# Patient Record
Sex: Female | Born: 1945 | Race: Black or African American | Hispanic: No | Marital: Married | Smoking: Never smoker
Health system: Southern US, Community
[De-identification: ages and names within clinical notes are randomized; demographics above are authoritative.]

## PROBLEM LIST (undated history)

## (undated) DIAGNOSIS — K5790 Diverticulosis of intestine, part unspecified, without perforation or abscess without bleeding: Secondary | ICD-10-CM

## (undated) DIAGNOSIS — N189 Chronic kidney disease, unspecified: Secondary | ICD-10-CM

## (undated) DIAGNOSIS — I10 Essential (primary) hypertension: Secondary | ICD-10-CM

## (undated) DIAGNOSIS — N183 Chronic kidney disease, stage 3 unspecified: Secondary | ICD-10-CM

## (undated) DIAGNOSIS — K589 Irritable bowel syndrome without diarrhea: Secondary | ICD-10-CM

## (undated) DIAGNOSIS — K635 Polyp of colon: Secondary | ICD-10-CM

## (undated) DIAGNOSIS — R059 Cough, unspecified: Secondary | ICD-10-CM

## (undated) DIAGNOSIS — Z923 Personal history of irradiation: Secondary | ICD-10-CM

## (undated) DIAGNOSIS — E7800 Pure hypercholesterolemia, unspecified: Secondary | ICD-10-CM

## (undated) DIAGNOSIS — E1122 Type 2 diabetes mellitus with diabetic chronic kidney disease: Secondary | ICD-10-CM

## (undated) DIAGNOSIS — E119 Type 2 diabetes mellitus without complications: Secondary | ICD-10-CM

## (undated) DIAGNOSIS — M1990 Unspecified osteoarthritis, unspecified site: Secondary | ICD-10-CM

## (undated) DIAGNOSIS — R05 Cough: Secondary | ICD-10-CM

## (undated) DIAGNOSIS — N1832 Chronic kidney disease, stage 3b: Secondary | ICD-10-CM

## (undated) HISTORY — PX: ABDOMINAL HYSTERECTOMY: SHX81

## (undated) HISTORY — PX: TONSILLECTOMY: SUR1361

## (undated) HISTORY — PX: COLONOSCOPY: SHX174

## (undated) HISTORY — PX: TUBAL LIGATION: SHX77

## (undated) HISTORY — PX: VAGINAL HYSTERECTOMY: SHX2639

## (undated) SURGERY — Surgical Case
Anesthesia: *Unknown

---

## 2004-10-06 ENCOUNTER — Ambulatory Visit: Payer: Self-pay | Admitting: Family Medicine

## 2004-11-07 ENCOUNTER — Ambulatory Visit: Payer: Self-pay | Admitting: Gastroenterology

## 2005-05-12 ENCOUNTER — Emergency Department: Payer: Self-pay | Admitting: Emergency Medicine

## 2005-10-12 ENCOUNTER — Ambulatory Visit: Payer: Self-pay | Admitting: Unknown Physician Specialty

## 2006-10-14 ENCOUNTER — Ambulatory Visit: Payer: Self-pay | Admitting: Unknown Physician Specialty

## 2007-10-17 ENCOUNTER — Ambulatory Visit: Payer: Self-pay | Admitting: Unknown Physician Specialty

## 2007-10-20 ENCOUNTER — Ambulatory Visit: Payer: Self-pay | Admitting: Unknown Physician Specialty

## 2008-04-18 ENCOUNTER — Ambulatory Visit: Payer: Self-pay | Admitting: Unknown Physician Specialty

## 2008-07-17 ENCOUNTER — Ambulatory Visit: Payer: Self-pay | Admitting: Unknown Physician Specialty

## 2008-07-18 ENCOUNTER — Ambulatory Visit: Payer: Self-pay | Admitting: Unknown Physician Specialty

## 2008-12-12 ENCOUNTER — Ambulatory Visit: Payer: Self-pay | Admitting: Unknown Physician Specialty

## 2009-11-11 ENCOUNTER — Ambulatory Visit: Payer: Self-pay | Admitting: Unknown Physician Specialty

## 2009-11-20 ENCOUNTER — Ambulatory Visit: Payer: Self-pay | Admitting: Unknown Physician Specialty

## 2009-12-21 ENCOUNTER — Ambulatory Visit: Payer: Self-pay | Admitting: Unknown Physician Specialty

## 2009-12-21 HISTORY — PX: BREAST EXCISIONAL BIOPSY: SUR124

## 2010-01-21 ENCOUNTER — Ambulatory Visit: Payer: Self-pay | Admitting: Unknown Physician Specialty

## 2010-02-12 ENCOUNTER — Ambulatory Visit: Payer: Self-pay | Admitting: Unknown Physician Specialty

## 2010-02-18 ENCOUNTER — Ambulatory Visit: Payer: Self-pay | Admitting: Unknown Physician Specialty

## 2010-03-20 ENCOUNTER — Ambulatory Visit: Payer: Self-pay | Admitting: Surgery

## 2010-03-27 ENCOUNTER — Ambulatory Visit: Payer: Self-pay | Admitting: Surgery

## 2011-03-05 ENCOUNTER — Ambulatory Visit: Payer: Self-pay | Admitting: Unknown Physician Specialty

## 2012-04-12 ENCOUNTER — Ambulatory Visit: Payer: Self-pay | Admitting: Unknown Physician Specialty

## 2013-06-22 ENCOUNTER — Ambulatory Visit: Payer: Self-pay | Admitting: Physician Assistant

## 2013-09-22 ENCOUNTER — Ambulatory Visit: Payer: Self-pay | Admitting: Unknown Physician Specialty

## 2014-08-24 ENCOUNTER — Ambulatory Visit: Payer: Self-pay | Admitting: Internal Medicine

## 2015-07-23 ENCOUNTER — Other Ambulatory Visit: Payer: Self-pay | Admitting: Internal Medicine

## 2015-07-23 DIAGNOSIS — Z1231 Encounter for screening mammogram for malignant neoplasm of breast: Secondary | ICD-10-CM

## 2015-08-27 ENCOUNTER — Ambulatory Visit
Admission: RE | Admit: 2015-08-27 | Discharge: 2015-08-27 | Disposition: A | Discharge status: Home / Self Care | Payer: Medicare Other | Source: Ambulatory Visit | Attending: Internal Medicine | Admitting: Internal Medicine

## 2015-08-27 ENCOUNTER — Other Ambulatory Visit: Payer: Self-pay | Admitting: Internal Medicine

## 2015-08-27 DIAGNOSIS — Z1231 Encounter for screening mammogram for malignant neoplasm of breast: Secondary | ICD-10-CM

## 2016-07-27 ENCOUNTER — Other Ambulatory Visit: Payer: Self-pay | Admitting: Internal Medicine

## 2016-07-27 DIAGNOSIS — Z1231 Encounter for screening mammogram for malignant neoplasm of breast: Secondary | ICD-10-CM

## 2016-09-28 ENCOUNTER — Ambulatory Visit
Admission: RE | Admit: 2016-09-28 | Discharge: 2016-09-28 | Disposition: A | Discharge status: Home / Self Care | Payer: Medicare Other | Source: Ambulatory Visit | Attending: Internal Medicine | Admitting: Internal Medicine

## 2016-09-28 ENCOUNTER — Other Ambulatory Visit: Payer: Self-pay | Admitting: Internal Medicine

## 2016-09-28 DIAGNOSIS — Z1231 Encounter for screening mammogram for malignant neoplasm of breast: Secondary | ICD-10-CM | POA: Diagnosis not present

## 2016-10-29 ENCOUNTER — Other Ambulatory Visit: Payer: Self-pay | Admitting: Internal Medicine

## 2016-10-29 DIAGNOSIS — N183 Chronic kidney disease, stage 3 unspecified: Secondary | ICD-10-CM

## 2016-11-05 ENCOUNTER — Ambulatory Visit
Admission: RE | Admit: 2016-11-05 | Discharge: 2016-11-05 | Disposition: A | Discharge status: Home / Self Care | Payer: Medicare Other | Source: Ambulatory Visit | Attending: Internal Medicine | Admitting: Internal Medicine

## 2016-11-05 DIAGNOSIS — N183 Chronic kidney disease, stage 3 unspecified: Secondary | ICD-10-CM

## 2017-08-24 ENCOUNTER — Other Ambulatory Visit: Payer: Self-pay | Admitting: Internal Medicine

## 2017-08-24 DIAGNOSIS — Z1231 Encounter for screening mammogram for malignant neoplasm of breast: Secondary | ICD-10-CM

## 2017-10-04 ENCOUNTER — Ambulatory Visit
Admission: RE | Admit: 2017-10-04 | Discharge: 2017-10-04 | Disposition: A | Discharge status: Home / Self Care | Payer: Medicare Other | Source: Ambulatory Visit | Attending: Internal Medicine | Admitting: Internal Medicine

## 2017-10-04 DIAGNOSIS — Z1231 Encounter for screening mammogram for malignant neoplasm of breast: Secondary | ICD-10-CM | POA: Diagnosis not present

## 2017-11-09 ENCOUNTER — Encounter: Payer: Self-pay | Admitting: *Deleted

## 2017-11-18 ENCOUNTER — Ambulatory Visit: Payer: Medicare Other | Admitting: Anesthesiology

## 2017-11-18 ENCOUNTER — Encounter
Admission: RE | Disposition: A | Discharge status: Home / Self Care | Payer: Self-pay | Source: Ambulatory Visit | Attending: Ophthalmology

## 2017-11-18 ENCOUNTER — Encounter: Payer: Self-pay | Admitting: *Deleted

## 2017-11-18 ENCOUNTER — Other Ambulatory Visit: Payer: Self-pay

## 2017-11-18 ENCOUNTER — Ambulatory Visit
Admission: RE | Admit: 2017-11-18 | Discharge: 2017-11-18 | Disposition: A | Discharge status: Home / Self Care | Payer: Medicare Other | Source: Ambulatory Visit | Attending: Ophthalmology | Admitting: Ophthalmology

## 2017-11-18 DIAGNOSIS — E7800 Pure hypercholesterolemia, unspecified: Secondary | ICD-10-CM | POA: Insufficient documentation

## 2017-11-18 DIAGNOSIS — I10 Essential (primary) hypertension: Secondary | ICD-10-CM | POA: Diagnosis not present

## 2017-11-18 DIAGNOSIS — Z7984 Long term (current) use of oral hypoglycemic drugs: Secondary | ICD-10-CM | POA: Insufficient documentation

## 2017-11-18 DIAGNOSIS — Z90710 Acquired absence of both cervix and uterus: Secondary | ICD-10-CM | POA: Diagnosis not present

## 2017-11-18 DIAGNOSIS — M1990 Unspecified osteoarthritis, unspecified site: Secondary | ICD-10-CM | POA: Diagnosis not present

## 2017-11-18 DIAGNOSIS — Z888 Allergy status to other drugs, medicaments and biological substances status: Secondary | ICD-10-CM | POA: Diagnosis not present

## 2017-11-18 DIAGNOSIS — H2512 Age-related nuclear cataract, left eye: Secondary | ICD-10-CM | POA: Diagnosis present

## 2017-11-18 DIAGNOSIS — E119 Type 2 diabetes mellitus without complications: Secondary | ICD-10-CM | POA: Insufficient documentation

## 2017-11-18 DIAGNOSIS — Z79899 Other long term (current) drug therapy: Secondary | ICD-10-CM | POA: Diagnosis not present

## 2017-11-18 DIAGNOSIS — K589 Irritable bowel syndrome without diarrhea: Secondary | ICD-10-CM | POA: Insufficient documentation

## 2017-11-18 HISTORY — DX: Cough, unspecified: R05.9

## 2017-11-18 HISTORY — DX: Chronic kidney disease, unspecified: N18.9

## 2017-11-18 HISTORY — DX: Unspecified osteoarthritis, unspecified site: M19.90

## 2017-11-18 HISTORY — DX: Cough: R05

## 2017-11-18 HISTORY — DX: Type 2 diabetes mellitus without complications: E11.9

## 2017-11-18 HISTORY — DX: Essential (primary) hypertension: I10

## 2017-11-18 HISTORY — DX: Irritable bowel syndrome, unspecified: K58.9

## 2017-11-18 HISTORY — PX: CATARACT EXTRACTION W/PHACO: SHX586

## 2017-11-18 LAB — GLUCOSE, CAPILLARY: GLUCOSE-CAPILLARY: 162 mg/dL — AB (ref 65–99)

## 2017-11-18 SURGERY — PHACOEMULSIFICATION, CATARACT, WITH IOL INSERTION
Anesthesia: Monitor Anesthesia Care | Site: Eye | Laterality: Left | Wound class: Clean

## 2017-11-18 MED ORDER — FENTANYL CITRATE (PF) 100 MCG/2ML IJ SOLN
INTRAMUSCULAR | Status: AC
  Filled 2017-11-18: qty 2

## 2017-11-18 MED ORDER — ARMC OPHTHALMIC DILATING DROPS
OPHTHALMIC | Status: AC
  Filled 2017-11-18: qty 0.4

## 2017-11-18 MED ORDER — SODIUM HYALURONATE 10 MG/ML IO SOLN
INTRAOCULAR | Status: DC | PRN
  Administered 2017-11-18: 0.55 mL via INTRAOCULAR

## 2017-11-18 MED ORDER — ARMC OPHTHALMIC DILATING DROPS
1.0000 "application " | OPHTHALMIC | Status: AC
  Administered 2017-11-18 (×2): 1 via OPHTHALMIC

## 2017-11-18 MED ORDER — POVIDONE-IODINE 5 % OP SOLN
OPHTHALMIC | Status: DC | PRN
  Administered 2017-11-18: 1 via OPHTHALMIC

## 2017-11-18 MED ORDER — MOXIFLOXACIN HCL 0.5 % OP SOLN
OPHTHALMIC | Status: AC
  Filled 2017-11-18: qty 3

## 2017-11-18 MED ORDER — LIDOCAINE HCL (PF) 4 % IJ SOLN
INTRAMUSCULAR | Status: AC
  Filled 2017-11-18: qty 5

## 2017-11-18 MED ORDER — FENTANYL CITRATE (PF) 100 MCG/2ML IJ SOLN
INTRAMUSCULAR | Status: DC | PRN
  Administered 2017-11-18: 50 ug via INTRAVENOUS

## 2017-11-18 MED ORDER — MIDAZOLAM HCL 2 MG/2ML IJ SOLN
INTRAMUSCULAR | Status: DC | PRN
  Administered 2017-11-18: 1 mg via INTRAVENOUS

## 2017-11-18 MED ORDER — MOXIFLOXACIN HCL 0.5 % OP SOLN: 1.0000 [drp] | OPHTHALMIC | Status: DC | PRN

## 2017-11-18 MED ORDER — SODIUM CHLORIDE 0.9 % IV SOLN
INTRAVENOUS | Status: DC
  Administered 2017-11-18 (×2): via INTRAVENOUS

## 2017-11-18 MED ORDER — BSS IO SOLN
INTRAOCULAR | Status: DC | PRN
  Administered 2017-11-18: 1 via INTRAOCULAR

## 2017-11-18 MED ORDER — MOXIFLOXACIN HCL 0.5 % OP SOLN
OPHTHALMIC | Status: DC | PRN
  Administered 2017-11-18: 0.2 mL via OPHTHALMIC

## 2017-11-18 MED ORDER — POVIDONE-IODINE 5 % OP SOLN
OPHTHALMIC | Status: AC
  Filled 2017-11-18: qty 30

## 2017-11-18 MED ORDER — MIDAZOLAM HCL 2 MG/2ML IJ SOLN
INTRAMUSCULAR | Status: AC
  Filled 2017-11-18: qty 2

## 2017-11-18 MED ORDER — SODIUM HYALURONATE 23 MG/ML IO SOLN
INTRAOCULAR | Status: AC
  Filled 2017-11-18: qty 0.6

## 2017-11-18 MED ORDER — LIDOCAINE HCL (PF) 4 % IJ SOLN
INTRAMUSCULAR | Status: DC | PRN
  Administered 2017-11-18: 4 mL via OPHTHALMIC

## 2017-11-18 MED ORDER — SODIUM HYALURONATE 23 MG/ML IO SOLN
INTRAOCULAR | Status: DC | PRN
  Administered 2017-11-18: 0.6 mL via INTRAOCULAR

## 2017-11-18 MED ORDER — EPINEPHRINE PF 1 MG/ML IJ SOLN
INTRAMUSCULAR | Status: AC
  Filled 2017-11-18: qty 1

## 2017-11-18 SURGICAL SUPPLY — 16 items
DISSECTOR HYDRO NUCLEUS 50X22 (MISCELLANEOUS) ×3 IMPLANT
GLOVE BIO SURGEON STRL SZ8 (GLOVE) ×3 IMPLANT
GLOVE BIOGEL M 6.5 STRL (GLOVE) ×3 IMPLANT
GLOVE SURG LX 7.5 STRW (GLOVE) ×2
GLOVE SURG LX STRL 7.5 STRW (GLOVE) ×1 IMPLANT
GOWN STRL REUS W/ TWL LRG LVL3 (GOWN DISPOSABLE) ×2 IMPLANT
GOWN STRL REUS W/TWL LRG LVL3 (GOWN DISPOSABLE) ×4
LABEL CATARACT MEDS ST (LABEL) ×3 IMPLANT
LENS IOL TECNIS ITEC 18.0 (Intraocular Lens) ×3 IMPLANT
PACK CATARACT (MISCELLANEOUS) ×3 IMPLANT
PACK CATARACT KING (MISCELLANEOUS) ×3 IMPLANT
PACK EYE AFTER SURG (MISCELLANEOUS) ×3 IMPLANT
SOL BSS BAG (MISCELLANEOUS) ×3
SOLUTION BSS BAG (MISCELLANEOUS) ×1 IMPLANT
WATER STERILE IRR 250ML POUR (IV SOLUTION) ×3 IMPLANT
WIPE NON LINTING 3.25X3.25 (MISCELLANEOUS) ×3 IMPLANT

## 2017-11-18 NOTE — H&P (Signed)
The History and Physical notes are on paper, have been signed, and are to be scanned.   I have examined the patient and there are no changes to the H&P.   Emma Torres 11/18/2017 9:14 AM

## 2017-11-18 NOTE — Transfer of Care (Signed)
Immediate Anesthesia Transfer of Care Note  Patient: Emma Torres  Procedure(s) Performed: CATARACT EXTRACTION PHACO AND INTRAOCULAR LENS PLACEMENT ([facility]) (Left Eye)  Patient Location: PACU  Anesthesia Type:MAC  Level of Consciousness: awake, alert  and oriented  Airway & Oxygen Therapy: Patient Spontanous Breathing  Post-op Assessment: Report given to RN and Post -op Vital signs reviewed and stable  Post vital signs: Reviewed and stable  Last Vitals:  Vitals:   11/18/17 0952 11/18/17 0953  BP: 140/67 140/67  Pulse: 61 61  Resp: 16 12  Temp: 36.9 C 36.9 C  SpO2: 99% 99%    Last Pain:  Vitals:   11/18/17 0953  TempSrc: Oral         Complications: No apparent anesthesia complications

## 2017-11-18 NOTE — Anesthesia Post-op Follow-up Note (Signed)
Anesthesia QCDR form completed.        

## 2017-11-18 NOTE — Anesthesia Postprocedure Evaluation (Signed)
Anesthesia Post Note  Patient: Emma Torres  Procedure(s) Performed: CATARACT EXTRACTION PHACO AND INTRAOCULAR LENS PLACEMENT ([place]) (Left Eye)  Patient location during evaluation: PACU Anesthesia Type: MAC Level of consciousness: awake, awake and alert and oriented Pain management: pain level controlled Vital Signs Assessment: post-procedure vital signs reviewed and stable Respiratory status: spontaneous breathing Cardiovascular status: blood pressure returned to baseline Postop Assessment: no headache, no backache and no apparent nausea or vomiting Anesthetic complications: no     Last Vitals:  Vitals:   11/18/17 0952 11/18/17 0953  BP: 140/67 140/67  Pulse: 61 61  Resp: 16 12  Temp: 36.9 C 36.9 C  SpO2: 99% 99%    Last Pain:  Vitals:   11/18/17 0953  TempSrc: Oral                 Hedda Slade

## 2017-11-18 NOTE — Anesthesia Preprocedure Evaluation (Signed)
Anesthesia Evaluation  Patient identified by MRN, date of birth, ID band Patient awake    Reviewed: Allergy & Precautions, H&P , NPO status , Patient's Chart, lab work & pertinent test results, reviewed documented beta blocker date and time   Airway Mallampati: II  TM Distance: >3 FB Neck ROM: full    Dental no notable dental hx. (+) Teeth Intact   Pulmonary neg pulmonary ROS,    Pulmonary exam normal breath sounds clear to auscultation       Cardiovascular Exercise Tolerance: Good hypertension, negative cardio ROS   Rhythm:regular Rate:Normal     Neuro/Psych negative neurological ROS  negative psych ROS   GI/Hepatic negative GI ROS, Neg liver ROS,   Endo/Other  negative endocrine ROSdiabetes  Renal/GU Renal disease     Musculoskeletal   Abdominal   Peds  Hematology negative hematology ROS (+)   Anesthesia Other Findings   Reproductive/Obstetrics negative OB ROS                             Anesthesia Physical Anesthesia Plan  ASA: III  Anesthesia Plan: MAC   Post-op Pain Management:    Induction:   PONV Risk Score and Plan: 2  Airway Management Planned:   Additional Equipment:   Intra-op Plan:   Post-operative Plan:   Informed Consent: I have reviewed the patients History and Physical, chart, labs and discussed the procedure including the risks, benefits and alternatives for the proposed anesthesia with the patient or authorized representative who has indicated his/her understanding and acceptance.     Plan Discussed with: CRNA  Anesthesia Plan Comments:         Anesthesia Quick Evaluation

## 2017-11-18 NOTE — Op Note (Signed)
OPERATIVE NOTE  Emma Torres [id number] 11/18/2017   PREOPERATIVE DIAGNOSIS:  Nuclear sclerotic cataract left eye.  H25.12   POSTOPERATIVE DIAGNOSIS:    Nuclear sclerotic cataract left eye.     PROCEDURE:  Phacoemusification with posterior chamber intraocular lens placement of the left eye   LENS:   Implant Name Type Inv. Item Serial No. Manufacturer Lot No. LRB No. Used  LENS IOL DIOP 18.0 - [id number] Intraocular Lens LENS IOL DIOP 18.0 [id number]  Left 1       PCB00 +18.0   ULTRASOUND TIME: 0 minutes 28.6 seconds.  CDE 3.10   SURGEON:  Benay Pillow, MD, MPH   ANESTHESIA:  Topical with tetracaine drops augmented with 1% preservative-free intracameral lidocaine.  ESTIMATED BLOOD LOSS: <1 mL   COMPLICATIONS:  None.   DESCRIPTION OF PROCEDURE:  The patient was identified in the holding room and transported to the operating room and placed in the supine position under the operating microscope.  The left eye was identified as the operative eye and it was prepped and draped in the usual sterile ophthalmic fashion.   A 1.0 millimeter clear-corneal paracentesis was made at the 5:00 position. 0.5 ml of preservative-free 1% lidocaine with epinephrine was injected into the anterior chamber.  The anterior chamber was filled with Healon 5 viscoelastic.  A 2.4 millimeter keratome was used to make a near-clear corneal incision at the 2:00 position.  A curvilinear capsulorrhexis was made with a cystotome and capsulorrhexis forceps.  Balanced salt solution was used to hydrodissect and hydrodelineate the nucleus.   Phacoemulsification was then used in stop and chop fashion to remove the lens nucleus and epinucleus.  The remaining cortex was then removed using the irrigation and aspiration handpiece. Healon was then placed into the capsular bag to distend it for lens placement.  A lens was then injected into the capsular bag.  The remaining viscoelastic was aspirated.   Wounds were  hydrated with balanced salt solution.  The anterior chamber was inflated to a physiologic pressure with balanced salt solution.  Intracameral vigamox 0.1 mL undiltued was injected into the eye and a drop placed onto the ocular surface.  No wound leaks were noted.  The patient was taken to the recovery room in stable condition without complications of anesthesia or surgery  Benay Pillow 11/18/2017, 9:50 AM

## 2017-11-18 NOTE — Discharge Instructions (Signed)
Eye Surgery Discharge Instructions  Expect mild scratchy sensation or mild soreness. DO NOT RUB YOUR EYE!  The day of surgery:  Minimal physical activity, but bed rest is not required  No reading, computer work, or close hand work  No bending, lifting, or straining.  May watch TV  For 24 hours:  No driving, legal decisions, or alcoholic beverages  Safety precautions  Eat anything you prefer: It is better to start with liquids, then soup then solid foods.  _____ Eye patch should be worn until postoperative exam tomorrow.  ____ Solar shield eyeglasses should be worn for comfort in the sunlight/patch while sleeping  Resume all regular medications including aspirin or Coumadin if these were discontinued prior to surgery. You may shower, bathe, shave, or wash your hair. Tylenol may be taken for mild discomfort.  Call your doctor if you experience significant pain, nausea, or vomiting, fever > 101 or other signs of infection. [phone] or [phone] Specific instructions:  Follow-up Information    Eulogio Bear, MD Follow up in 1 day(s).   Specialty:  Ophthalmology Why:  Friday, November 19, 2017 at [facility] information: [address] [place] [address] [phone]

## 2017-11-18 NOTE — Anesthesia Procedure Notes (Signed)
Procedure Name: MAC Date/Time: 11/18/2017 9:25 AM Performed by: Allean Found, CRNA Pre-anesthesia Checklist: Patient identified, Emergency Drugs available, Suction available, Patient being monitored and Timeout performed Patient Re-evaluated:Patient Re-evaluated prior to induction Oxygen Delivery Method: Nasal cannula Preoxygenation: Pre-oxygenation with 100% oxygen Placement Confirmation: positive ETCO2

## 2018-01-05 ENCOUNTER — Encounter: Payer: Self-pay | Admitting: *Deleted

## 2018-01-13 ENCOUNTER — Ambulatory Visit: Payer: Medicare Other | Admitting: Certified Registered"

## 2018-01-13 ENCOUNTER — Other Ambulatory Visit: Payer: Self-pay

## 2018-01-13 ENCOUNTER — Ambulatory Visit
Admission: RE | Admit: 2018-01-13 | Discharge: 2018-01-13 | Disposition: A | Discharge status: Home / Self Care | Payer: Medicare Other | Source: Ambulatory Visit | Attending: Ophthalmology | Admitting: Ophthalmology

## 2018-01-13 ENCOUNTER — Encounter
Admission: RE | Disposition: A | Discharge status: Home / Self Care | Payer: Self-pay | Source: Ambulatory Visit | Attending: Ophthalmology

## 2018-01-13 ENCOUNTER — Encounter: Payer: Self-pay | Admitting: *Deleted

## 2018-01-13 DIAGNOSIS — Z7982 Long term (current) use of aspirin: Secondary | ICD-10-CM | POA: Diagnosis not present

## 2018-01-13 DIAGNOSIS — I10 Essential (primary) hypertension: Secondary | ICD-10-CM | POA: Diagnosis not present

## 2018-01-13 DIAGNOSIS — E119 Type 2 diabetes mellitus without complications: Secondary | ICD-10-CM | POA: Diagnosis not present

## 2018-01-13 DIAGNOSIS — Z79899 Other long term (current) drug therapy: Secondary | ICD-10-CM | POA: Insufficient documentation

## 2018-01-13 DIAGNOSIS — E7800 Pure hypercholesterolemia, unspecified: Secondary | ICD-10-CM | POA: Diagnosis not present

## 2018-01-13 DIAGNOSIS — Z7984 Long term (current) use of oral hypoglycemic drugs: Secondary | ICD-10-CM | POA: Insufficient documentation

## 2018-01-13 DIAGNOSIS — K589 Irritable bowel syndrome without diarrhea: Secondary | ICD-10-CM | POA: Diagnosis not present

## 2018-01-13 DIAGNOSIS — H2511 Age-related nuclear cataract, right eye: Secondary | ICD-10-CM | POA: Insufficient documentation

## 2018-01-13 DIAGNOSIS — M1990 Unspecified osteoarthritis, unspecified site: Secondary | ICD-10-CM | POA: Insufficient documentation

## 2018-01-13 HISTORY — PX: CATARACT EXTRACTION W/PHACO: SHX586

## 2018-01-13 LAB — GLUCOSE, CAPILLARY: Glucose-Capillary: 115 mg/dL — ABNORMAL HIGH (ref 65–99)

## 2018-01-13 SURGERY — PHACOEMULSIFICATION, CATARACT, WITH IOL INSERTION
Anesthesia: Monitor Anesthesia Care | Laterality: Right

## 2018-01-13 MED ORDER — MIDAZOLAM HCL 2 MG/2ML IJ SOLN
INTRAMUSCULAR | Status: DC | PRN
  Administered 2018-01-13 (×2): 1 mg via INTRAVENOUS

## 2018-01-13 MED ORDER — FENTANYL CITRATE (PF) 100 MCG/2ML IJ SOLN
25.0000 ug | INTRAMUSCULAR | Status: DC | PRN
  Administered 2018-01-13: 25 ug via INTRAVENOUS

## 2018-01-13 MED ORDER — SODIUM HYALURONATE 23 MG/ML IO SOLN
INTRAOCULAR | Status: DC | PRN
  Administered 2018-01-13: 0.6 mL via INTRAOCULAR

## 2018-01-13 MED ORDER — MOXIFLOXACIN HCL 0.5 % OP SOLN
OPHTHALMIC | Status: DC | PRN
Start: 2018-01-13 — End: 2018-01-13
  Administered 2018-01-13: .5 mL

## 2018-01-13 MED ORDER — ARMC OPHTHALMIC DILATING DROPS
1.0000 "application " | OPHTHALMIC | Status: AC
  Administered 2018-01-13 (×3): 1 via OPHTHALMIC

## 2018-01-13 MED ORDER — FENTANYL CITRATE (PF) 100 MCG/2ML IJ SOLN: 25.0000 ug | INTRAMUSCULAR | Status: DC | PRN

## 2018-01-13 MED ORDER — SODIUM HYALURONATE 23 MG/ML IO SOLN
INTRAOCULAR | Status: AC
  Filled 2018-01-13: qty 0.6

## 2018-01-13 MED ORDER — LIDOCAINE HCL (PF) 4 % IJ SOLN
INTRAMUSCULAR | Status: AC
  Filled 2018-01-13: qty 5

## 2018-01-13 MED ORDER — ONDANSETRON HCL 4 MG/2ML IJ SOLN
4.0000 mg | Freq: Once | INTRAMUSCULAR | Status: AC | PRN
  Administered 2018-01-13: 4 mg via INTRAVENOUS

## 2018-01-13 MED ORDER — MIDAZOLAM HCL 2 MG/2ML IJ SOLN
INTRAMUSCULAR | Status: AC
  Filled 2018-01-13: qty 2

## 2018-01-13 MED ORDER — ONDANSETRON HCL 4 MG/2ML IJ SOLN: 4.0000 mg | Freq: Once | INTRAMUSCULAR | Status: DC | PRN

## 2018-01-13 MED ORDER — MOXIFLOXACIN HCL 0.5 % OP SOLN: 1.0000 [drp] | OPHTHALMIC | Status: DC | PRN

## 2018-01-13 MED ORDER — MOXIFLOXACIN HCL 0.5 % OP SOLN
OPHTHALMIC | Status: AC
  Filled 2018-01-13: qty 3

## 2018-01-13 MED ORDER — SODIUM HYALURONATE 10 MG/ML IO SOLN
INTRAOCULAR | Status: DC | PRN
  Administered 2018-01-13: 0.55 mL via INTRAOCULAR

## 2018-01-13 MED ORDER — BSS PLUS IO SOLN
INTRAOCULAR | Status: DC | PRN
  Administered 2018-01-13: 1 via INTRAOCULAR

## 2018-01-13 MED ORDER — EPINEPHRINE PF 1 MG/ML IJ SOLN
INTRAMUSCULAR | Status: AC
  Filled 2018-01-13: qty 1

## 2018-01-13 MED ORDER — FENTANYL CITRATE (PF) 100 MCG/2ML IJ SOLN
INTRAMUSCULAR | Status: AC
  Filled 2018-01-13: qty 2

## 2018-01-13 MED ORDER — ARMC OPHTHALMIC DILATING DROPS
OPHTHALMIC | Status: AC
  Administered 2018-01-13: 1 via OPHTHALMIC
  Filled 2018-01-13: qty 0.4

## 2018-01-13 MED ORDER — POVIDONE-IODINE 5 % OP SOLN
OPHTHALMIC | Status: AC
  Filled 2018-01-13: qty 30

## 2018-01-13 MED ORDER — POVIDONE-IODINE 5 % OP SOLN
OPHTHALMIC | Status: DC | PRN
  Administered 2018-01-13: 1 via OPHTHALMIC

## 2018-01-13 MED ORDER — SODIUM CHLORIDE 0.9 % IV SOLN
INTRAVENOUS | Status: DC
  Administered 2018-01-13: 07:00:00 via INTRAVENOUS

## 2018-01-13 MED ORDER — LIDOCAINE HCL (PF) 4 % IJ SOLN
INTRAOCULAR | Status: DC | PRN
  Administered 2018-01-13: .5 mL via OPHTHALMIC

## 2018-01-13 SURGICAL SUPPLY — 18 items
DISSECTOR HYDRO NUCLEUS 50X22 (MISCELLANEOUS) ×3 IMPLANT
GLOVE BIO SURGEON STRL SZ8 (GLOVE) ×3 IMPLANT
GLOVE BIOGEL M 6.5 STRL (GLOVE) ×3 IMPLANT
GLOVE SURG LX 7.5 STRW (GLOVE) ×2
GLOVE SURG LX STRL 7.5 STRW (GLOVE) ×1 IMPLANT
GOWN STRL REUS W/ TWL LRG LVL3 (GOWN DISPOSABLE) ×2 IMPLANT
GOWN STRL REUS W/TWL LRG LVL3 (GOWN DISPOSABLE) ×4
LABEL CATARACT MEDS ST (LABEL) ×3 IMPLANT
LENS IOL TECNIS ITEC 18.5 (Intraocular Lens) ×3 IMPLANT
PACK CATARACT (MISCELLANEOUS) ×3 IMPLANT
PACK CATARACT KING (MISCELLANEOUS) ×3 IMPLANT
PACK EYE AFTER SURG (MISCELLANEOUS) ×3 IMPLANT
SOL BAL SALT 15ML (MISCELLANEOUS) ×3
SOL BSS BAG (MISCELLANEOUS) ×3
SOLUTION BAL SALT 15ML (MISCELLANEOUS) ×1 IMPLANT
SOLUTION BSS BAG (MISCELLANEOUS) ×1 IMPLANT
WATER STERILE IRR 250ML POUR (IV SOLUTION) ×3 IMPLANT
WIPE NON LINTING 3.25X3.25 (MISCELLANEOUS) ×3 IMPLANT

## 2018-01-13 NOTE — Anesthesia Postprocedure Evaluation (Signed)
Anesthesia Post Note  Patient: Emma Torres  Procedure(s) Performed: CATARACT EXTRACTION PHACO AND INTRAOCULAR LENS PLACEMENT ([facility]) (Right )  Patient location during evaluation: PACU Anesthesia Type: MAC Level of consciousness: awake Pain management: pain level controlled Vital Signs Assessment: post-procedure vital signs reviewed and stable Respiratory status: spontaneous breathing Cardiovascular status: blood pressure returned to baseline Postop Assessment: no headache Anesthetic complications: no     Last Vitals:  Vitals:   01/13/18 0623  BP: 133/66  Pulse: (!) 56  Resp: 18  Temp: 37.1 C  SpO2: 98%    Last Pain:  Vitals:   01/13/18 7867  TempSrc: Oral                 Philbert Riser

## 2018-01-13 NOTE — H&P (Signed)
The History and Physical notes are on paper, have been signed, and are to be scanned.   I have examined the patient and there are no changes to the H&P.   Benay Pillow 01/13/2018 7:19 AM

## 2018-01-13 NOTE — Op Note (Signed)
OPERATIVE NOTE  Emma Torres [id number] 01/13/2018   PREOPERATIVE DIAGNOSIS:  Nuclear sclerotic cataract right eye.  H25.11   POSTOPERATIVE DIAGNOSIS:    Nuclear sclerotic cataract right eye.     PROCEDURE:  Phacoemusification with posterior chamber intraocular lens placement of the right eye   LENS:   Implant Name Type Inv. Item Serial No. Manufacturer Lot No. LRB No. Used  TECNIS bIOL   [id number]   Right 1       PCB00 +18.5   ULTRASOUND TIME: 0 minutes 29.4 seconds.  CDE 2.82   SURGEON:  Benay Pillow, MD, MPH  ANESTHESIOLOGIST: Anesthesiologist: Alvin Critchley, MD CRNA: Philbert Riser, CRNA   ANESTHESIA:  Topical with tetracaine drops augmented with 1% preservative-free intracameral lidocaine.  ESTIMATED BLOOD LOSS: less than 1 mL.   COMPLICATIONS:  None.   DESCRIPTION OF PROCEDURE:  The patient was identified in the holding room and transported to the operating room and placed in the supine position under the operating microscope.  The right eye was identified as the operative eye and it was prepped and draped in the usual sterile ophthalmic fashion.   A 1.0 millimeter clear-corneal paracentesis was made at the 10:30 position. 0.5 ml of preservative-free 1% lidocaine with epinephrine was injected into the anterior chamber.  The anterior chamber was filled with Healon 5 viscoelastic.  A 2.4 millimeter keratome was used to make a near-clear corneal incision at the 8:00 position.  A curvilinear capsulorrhexis was made with a cystotome and capsulorrhexis forceps.  Balanced salt solution was used to hydrodissect and hydrodelineate the nucleus.   Phacoemulsification was then used in stop and chop fashion to remove the lens nucleus and epinucleus.  The remaining cortex was then removed using the irrigation and aspiration handpiece. Healon was then placed into the capsular bag to distend it for lens placement.  A lens was then injected into the capsular bag.  The remaining  viscoelastic was aspirated.   Wounds were hydrated with balanced salt solution.  The anterior chamber was inflated to a physiologic pressure with balanced salt solution.   Intracameral vigamox 0.1 mL undiluted was injected into the eye and a drop placed onto the ocular surface.  No wound leaks were noted.  The patient was taken to the recovery room in stable condition without complications of anesthesia or surgery  Benay Pillow 01/13/2018, 8:03 AM

## 2018-01-13 NOTE — Transfer of Care (Signed)
Immediate Anesthesia Transfer of Care Note  Patient: Emma Torres  Procedure(s) Performed: CATARACT EXTRACTION PHACO AND INTRAOCULAR LENS PLACEMENT ([facility]) (Right )  Patient Location: PACU  Anesthesia Type:MAC  Level of Consciousness: awake  Airway & Oxygen Therapy: Patient Spontanous Breathing  Post-op Assessment: Report given to RN  Post vital signs: Reviewed and stable  Last Vitals:  Vitals:   01/13/18 0623  BP: 133/66  Pulse: (!) 56  Resp: 18  Temp: 37.1 C  SpO2: 98%    Last Pain:  Vitals:   01/13/18 0623  TempSrc: Oral         Complications: No apparent anesthesia complications

## 2018-01-13 NOTE — Discharge Instructions (Signed)
Eye Surgery Discharge Instructions  Expect mild scratchy sensation or mild soreness. DO NOT RUB YOUR EYE!  The day of surgery:  Minimal physical activity, but bed rest is not required  No reading, computer work, or close hand work  No bending, lifting, or straining.  May watch TV  For 24 hours:  No driving, legal decisions, or alcoholic beverages  Safety precautions  Eat anything you prefer: It is better to start with liquids, then soup then solid foods.  _____ Eye patch should be worn until postoperative exam tomorrow.  ____ Solar shield eyeglasses should be worn for comfort in the sunlight/patch while sleeping  Resume all regular medications including aspirin or Coumadin if these were discontinued prior to surgery. You may shower, bathe, shave, or wash your hair. Tylenol may be taken for mild discomfort.  Call your doctor if you experience significant pain, nausea, or vomiting, fever > 101 or other signs of infection. [phone] or [phone] Specific instructions:  Follow-up Information    Eulogio Bear, MD Follow up.   Specialty:  Ophthalmology Why:  January 25 at 10:15am Contact information: [place] [address] [phone]

## 2018-01-13 NOTE — Anesthesia Post-op Follow-up Note (Signed)
Anesthesia QCDR form completed.        

## 2018-01-13 NOTE — Anesthesia Preprocedure Evaluation (Signed)
Anesthesia Evaluation  Patient identified by MRN, date of birth, ID band Patient awake    Reviewed: Allergy & Precautions, H&P , NPO status , Patient's Chart, lab work & pertinent test results, reviewed documented beta blocker date and time   Airway Mallampati: II  TM Distance: >3 FB Neck ROM: full    Dental no notable dental hx. (+) Teeth Intact   Pulmonary neg pulmonary ROS,    Pulmonary exam normal breath sounds clear to auscultation       Cardiovascular Exercise Tolerance: Good hypertension, Pt. on medications and Pt. on home beta blockers negative cardio ROS   Rhythm:regular Rate:Normal     Neuro/Psych negative neurological ROS  negative psych ROS   GI/Hepatic negative GI ROS, Neg liver ROS,   Endo/Other  negative endocrine ROSdiabetes  Renal/GU Renal InsufficiencyRenal disease  negative genitourinary   Musculoskeletal  (+) Arthritis ,   Abdominal   Peds negative pediatric ROS (+)  Hematology negative hematology ROS (+)   Anesthesia Other Findings   Reproductive/Obstetrics negative OB ROS                             Anesthesia Physical  Anesthesia Plan  ASA: III  Anesthesia Plan: MAC   Post-op Pain Management:    Induction:   PONV Risk Score and Plan: 2  Airway Management Planned:   Additional Equipment:   Intra-op Plan:   Post-operative Plan:   Informed Consent: I have reviewed the patients History and Physical, chart, labs and discussed the procedure including the risks, benefits and alternatives for the proposed anesthesia with the patient or authorized representative who has indicated his/her understanding and acceptance.     Plan Discussed with: CRNA  Anesthesia Plan Comments:         Anesthesia Quick Evaluation

## 2018-08-08 ENCOUNTER — Other Ambulatory Visit: Payer: Self-pay | Admitting: Internal Medicine

## 2018-08-08 DIAGNOSIS — Z1231 Encounter for screening mammogram for malignant neoplasm of breast: Secondary | ICD-10-CM

## 2018-08-29 ENCOUNTER — Ambulatory Visit: Payer: Medicare Other | Admitting: Anesthesiology

## 2018-08-29 ENCOUNTER — Ambulatory Visit
Admission: RE | Admit: 2018-08-29 | Discharge: 2018-08-29 | Disposition: A | Discharge status: Home / Self Care | Payer: Medicare Other | Source: Ambulatory Visit | Attending: Unknown Physician Specialty | Admitting: Unknown Physician Specialty

## 2018-08-29 ENCOUNTER — Encounter: Payer: Self-pay | Admitting: *Deleted

## 2018-08-29 ENCOUNTER — Encounter
Admission: RE | Disposition: A | Discharge status: Home / Self Care | Payer: Self-pay | Source: Ambulatory Visit | Attending: Unknown Physician Specialty

## 2018-08-29 DIAGNOSIS — N189 Chronic kidney disease, unspecified: Secondary | ICD-10-CM | POA: Insufficient documentation

## 2018-08-29 DIAGNOSIS — I129 Hypertensive chronic kidney disease with stage 1 through stage 4 chronic kidney disease, or unspecified chronic kidney disease: Secondary | ICD-10-CM | POA: Diagnosis not present

## 2018-08-29 DIAGNOSIS — E1122 Type 2 diabetes mellitus with diabetic chronic kidney disease: Secondary | ICD-10-CM | POA: Diagnosis not present

## 2018-08-29 DIAGNOSIS — Z7982 Long term (current) use of aspirin: Secondary | ICD-10-CM | POA: Insufficient documentation

## 2018-08-29 DIAGNOSIS — Z79899 Other long term (current) drug therapy: Secondary | ICD-10-CM | POA: Insufficient documentation

## 2018-08-29 DIAGNOSIS — Z7984 Long term (current) use of oral hypoglycemic drugs: Secondary | ICD-10-CM | POA: Diagnosis not present

## 2018-08-29 DIAGNOSIS — K5730 Diverticulosis of large intestine without perforation or abscess without bleeding: Secondary | ICD-10-CM | POA: Insufficient documentation

## 2018-08-29 DIAGNOSIS — Z86010 Personal history of colonic polyps: Secondary | ICD-10-CM | POA: Insufficient documentation

## 2018-08-29 DIAGNOSIS — Z09 Encounter for follow-up examination after completed treatment for conditions other than malignant neoplasm: Secondary | ICD-10-CM | POA: Insufficient documentation

## 2018-08-29 DIAGNOSIS — K640 First degree hemorrhoids: Secondary | ICD-10-CM | POA: Diagnosis not present

## 2018-08-29 HISTORY — PX: COLONOSCOPY WITH PROPOFOL: SHX5780

## 2018-08-29 LAB — GLUCOSE, CAPILLARY: Glucose-Capillary: 88 mg/dL (ref 70–99)

## 2018-08-29 SURGERY — COLONOSCOPY WITH PROPOFOL
Anesthesia: General

## 2018-08-29 MED ORDER — SODIUM CHLORIDE 0.9 % IV SOLN: INTRAVENOUS | Status: DC

## 2018-08-29 MED ORDER — PROPOFOL 500 MG/50ML IV EMUL
INTRAVENOUS | Status: DC | PRN
  Administered 2018-08-29: 50 ug/kg/min via INTRAVENOUS

## 2018-08-29 MED ORDER — MIDAZOLAM HCL 5 MG/5ML IJ SOLN
INTRAMUSCULAR | Status: DC | PRN
  Administered 2018-08-29: 2 mg via INTRAVENOUS

## 2018-08-29 MED ORDER — PROPOFOL 500 MG/50ML IV EMUL
INTRAVENOUS | Status: AC
  Filled 2018-08-29: qty 50

## 2018-08-29 MED ORDER — SODIUM CHLORIDE 0.9 % IV SOLN
INTRAVENOUS | Status: DC
  Administered 2018-08-29: 12:00:00 via INTRAVENOUS

## 2018-08-29 MED ORDER — GLYCOPYRROLATE 0.2 MG/ML IJ SOLN
INTRAMUSCULAR | Status: AC
  Filled 2018-08-29: qty 1

## 2018-08-29 MED ORDER — FENTANYL CITRATE (PF) 100 MCG/2ML IJ SOLN
INTRAMUSCULAR | Status: AC
  Filled 2018-08-29: qty 2

## 2018-08-29 MED ORDER — MIDAZOLAM HCL 2 MG/2ML IJ SOLN
INTRAMUSCULAR | Status: AC
  Filled 2018-08-29: qty 2

## 2018-08-29 MED ORDER — FENTANYL CITRATE (PF) 100 MCG/2ML IJ SOLN
INTRAMUSCULAR | Status: DC | PRN
  Administered 2018-08-29 (×2): 50 ug via INTRAVENOUS

## 2018-08-29 MED ORDER — LIDOCAINE HCL (PF) 2 % IJ SOLN
INTRAMUSCULAR | Status: DC | PRN
  Administered 2018-08-29: 60 mg

## 2018-08-29 MED ORDER — PROPOFOL 10 MG/ML IV BOLUS
INTRAVENOUS | Status: DC | PRN
  Administered 2018-08-29 (×2): 10 mg via INTRAVENOUS

## 2018-08-29 MED ORDER — LIDOCAINE HCL (PF) 2 % IJ SOLN
INTRAMUSCULAR | Status: AC
  Filled 2018-08-29: qty 10

## 2018-08-29 NOTE — Op Note (Signed)
[facility] Gastroenterology Patient Name: Emma Torres Procedure Date: 08/29/2018 12:18 PM MRN: [id number] Account #: [id number] Date of Birth: 1946-01-18 Admit Type: Outpatient Age: 72 Room: [facility] ENDO ROOM 1 Gender: Female Note Status: Finalized Procedure:            Colonoscopy Indications:          High risk colon cancer surveillance: Personal history                        of colonic polyps Providers:            Manya Silvas, MD Referring MD:         Glendon Axe (Referring MD) Medicines:            Propofol per Anesthesia Complications:        No immediate complications. Procedure:            Pre-Anesthesia Assessment:                       - After reviewing the risks and benefits, the patient                        was deemed in satisfactory condition to undergo the                        procedure.                       After obtaining informed consent, the colonoscope was                        passed under direct vision. Throughout the procedure,                        the patient's blood pressure, pulse, and oxygen                        saturations were monitored continuously. The                        Colonoscope was introduced through the anus and                        advanced to the the ascending colon. The colonoscopy                        was somewhat difficult due to a redundant colon,                        significant looping and a tortuous colon. Successful                        completion of the procedure was aided by applying                        abdominal pressure. The patient tolerated the procedure                        well. The quality of the bowel preparation was good. Findings:      The scope was passed with some effort to the proximal ascending colon  which was very close to the cecum which was only a short distance to the       cecum.      Multiple small and large-mouthed diverticula were found in the sigmoid        colon, descending colon, transverse colon and ascending colon.      Internal hemorrhoids were found during endoscopy. The hemorrhoids were       small and Grade I (internal hemorrhoids that do not prolapse). Impression:           - Diverticulosis in the sigmoid colon, in the                        descending colon, in the transverse colon and in the                        ascending colon.                       - Internal hemorrhoids.                       - No specimens collected. Recommendation:       - Repeat colonoscopy in 5 years for surveillance. Manya Silvas, MD 08/29/2018 12:44:48 PM This report has been signed electronically. Number of Addenda: 0 Note Initiated On: 08/29/2018 12:18 PM Scope Withdrawal Time: 0 hours 8 minutes 35 seconds  Total Procedure Duration: 0 hours 18 minutes 5 seconds       [facility]

## 2018-08-29 NOTE — Transfer of Care (Addendum)
Immediate Anesthesia Transfer of Care Note  Patient: Emma Torres  Procedure(s) Performed: COLONOSCOPY WITH PROPOFOL (N/A )  Patient Location: PACU  Anesthesia Type:General  Level of Consciousness: sedated  Airway & Oxygen Therapy: Patient Spontanous Breathing and Patient connected to nasal cannula oxygen  Post-op Assessment: Report given to RN and Post -op Vital signs reviewed and stable  Post vital signs: Reviewed and stable  Last Vitals:  Vitals Value Taken Time  BP    Temp    Pulse    Resp    SpO2      Last Pain:  Vitals:   08/29/18 1113  TempSrc: Tympanic  PainSc: 0-No pain         Complications: No apparent anesthesia complications

## 2018-08-29 NOTE — H&P (Signed)
Primary Care Physician:  Glendon Axe, MD Primary Gastroenterologist:  Dr. Vira Agar  Pre-Procedure History & Physical: HPI:  Emma Torres is a 72 y.o. female is here for an colonoscopy.  This is for personal history of colon polyps.   Past Medical History:  Diagnosis Date  . Arthritis   . Chronic kidney disease   . Cough    chronic  . Diabetes mellitus without complication ([place])   . Hypertension   . IBS (irritable bowel syndrome)     Past Surgical History:  Procedure Laterality Date  . ABDOMINAL HYSTERECTOMY    . BREAST EXCISIONAL BIOPSY Right 2011   negative  . CATARACT EXTRACTION W/PHACO Left 11/18/2017   Procedure: CATARACT EXTRACTION PHACO AND INTRAOCULAR LENS PLACEMENT (IOC);  Surgeon: Eulogio Bear, MD;  Location: [facility] ORS;  Service: Ophthalmology;  Laterality: Left;  Lot # [id number] H [place]: 00:28.6 AP%: 10.8 CDE: 3.10   . CATARACT EXTRACTION W/PHACO Right 01/13/2018   Procedure: CATARACT EXTRACTION PHACO AND INTRAOCULAR LENS PLACEMENT (IOC);  Surgeon: Eulogio Bear, MD;  Location: [facility] ORS;  Service: Ophthalmology;  Laterality: Right;  cassette lot #  [id number]  [place]   00:29.4 AP%   9.6 CDE2.82  . TONSILLECTOMY    . TUBAL LIGATION      Prior to Admission medications   Medication Sig Start Date End Date Taking? Authorizing Provider  aspirin EC 81 MG tablet Take 81 mg by mouth daily.   Yes [provider]  Biotin w/ Vitamins C & E (HAIR/SKIN/NAILS PO) Take 1 tablet by mouth daily.   Yes [provider]  Calcium Carbonate-Vitamin D (CALTRATE 600+D PO) Take 1 tablet by mouth daily.   Yes [provider]  glipiZIDE (GLUCOTROL) 10 MG tablet Take 20 mg by mouth 2 (two) times daily before a meal.    Yes [provider]  lisinopril-hydrochlorothiazide (PRINZIDE,ZESTORETIC) 20-12.5 MG tablet Take 1 tablet by mouth daily.   Yes [provider]  Magnesium 250 MG TABS Take 250 mg by mouth daily.   Yes [provider]  metFORMIN (GLUCOPHAGE) 1000 MG tablet Take 1,000-1,500 mg by mouth See admin instructions. Take 1000 mg by mouth in the morning and take 1500 mg by mouth in the evening   Yes [provider]  metoprolol succinate (TOPROL-XL) 50 MG 24 hr tablet Take 50 mg by mouth daily. Take with or immediately following a meal.   Yes [provider]  omeprazole (PRILOSEC) 20 MG capsule Take 20 mg by mouth daily as needed (for acid reflux or heartburn).   Yes [provider]  pravastatin (PRAVACHOL) 80 MG tablet Take 80 mg by mouth daily.   Yes [provider]  saccharomyces boulardii (FLORASTOR) 250 MG capsule Take 250 mg by mouth daily.   Yes [provider]  sitaGLIPtin (JANUVIA) 50 MG tablet Take 50 mg by mouth daily.   Yes [provider]  ibuprofen (ADVIL,MOTRIN) 200 MG tablet Take 200 mg by mouth every 6 (six) hours as needed for headache or moderate pain.    [provider]    Allergies as of 07/25/2018 - Review Complete 01/13/2018  Allergen Reaction Noted  . Tape Rash and Other (See Comments) 11/09/2017    Family History  Problem Relation Age of Onset  . Breast cancer Maternal Aunt        two aunts. 60's  . Breast cancer Maternal Grandmother        53's    Social History  Socioeconomic History  . Marital status: Married    Spouse name: Not on file  . Number of children: Not on file  . Years of education: Not on file  . Highest education level: Not on file  Occupational History  . Not on file  Social Needs  . Financial resource strain: Not on file  . Food insecurity:    Worry: Not on file    Inability: Not on file  . Transportation needs:    Medical: Not on file    Non-medical: Not on file  Tobacco Use  . Smoking status: Never Smoker  . Smokeless tobacco: Never Used  Substance and Sexual Activity  . Alcohol use: No    Frequency: Never  . Drug use: No  . Sexual activity: Not on file  Lifestyle  . Physical  activity:    Days per week: Not on file    Minutes per session: Not on file  . Stress: Not on file  Relationships  . Social connections:    Talks on phone: Not on file    Gets together: Not on file    Attends religious service: Not on file    Active member of club or organization: Not on file    Attends meetings of clubs or organizations: Not on file    Relationship status: Not on file  . Intimate partner violence:    Fear of current or ex partner: Not on file    Emotionally abused: Not on file    Physically abused: Not on file    Forced sexual activity: Not on file  Other Topics Concern  . Not on file  Social History Narrative  . Not on file    Review of Systems: See HPI, otherwise negative ROS  Physical Exam: BP (!) 144/70   Pulse 72   Temp (!) 97.3 F (36.3 C) (Tympanic)   Resp 16   Ht 5\' 4"  (1.626 m)   Wt 94.3 kg   SpO2 100%   BMI 35.68 kg/m  General:   Alert,  pleasant and cooperative in NAD Head:  Normocephalic and atraumatic. Neck:  Supple; no masses or thyromegaly. Lungs:  Clear throughout to auscultation.    Heart:  Regular rate and rhythm. Abdomen:  Soft, nontender and nondistended. Normal bowel sounds, without guarding, and without rebound.   Neurologic:  Alert and  oriented x4;  grossly normal neurologically.  Impression/Plan: Emma Torres is here for an colonoscopy to be performed for Personal history of colon polyps.  Risks, benefits, limitations, and alternatives regarding  colonoscopy have been reviewed with the patient.  Questions have been answered.  All parties agreeable.   Gaylyn Cheers, MD  08/29/2018, 12:14 PM

## 2018-08-29 NOTE — Anesthesia Post-op Follow-up Note (Signed)
Anesthesia QCDR form completed.        

## 2018-08-29 NOTE — Anesthesia Preprocedure Evaluation (Signed)
Anesthesia Evaluation  Patient identified by MRN, date of birth, ID band Patient awake    Reviewed: Allergy & Precautions, H&P , NPO status , Patient's Chart, lab work & pertinent test results, reviewed documented beta blocker date and time   Airway Mallampati: II   Neck ROM: full    Dental  (+) Poor Dentition   Pulmonary neg pulmonary ROS,    Pulmonary exam normal        Cardiovascular hypertension, negative cardio ROS Normal cardiovascular exam Rhythm:regular Rate:Normal     Neuro/Psych negative neurological ROS  negative psych ROS   GI/Hepatic negative GI ROS, Neg liver ROS,   Endo/Other  negative endocrine ROSdiabetes, Well Controlled, Type 2, Oral Hypoglycemic Agents  Renal/GU Renal diseasenegative Renal ROS  negative genitourinary   Musculoskeletal   Abdominal   Peds  Hematology negative hematology ROS (+)   Anesthesia Other Findings Past Medical History: No date: Arthritis No date: Chronic kidney disease No date: Cough     Comment:  chronic No date: Diabetes mellitus without complication (HCC) No date: Hypertension No date: IBS (irritable bowel syndrome) Past Surgical History: No date: ABDOMINAL HYSTERECTOMY 2011: BREAST EXCISIONAL BIOPSY; Right     Comment:  negative 11/18/2017: CATARACT EXTRACTION W/PHACO; Left     Comment:  Procedure: CATARACT EXTRACTION PHACO AND INTRAOCULAR               LENS PLACEMENT (IOC);  Surgeon: Eulogio Bear, MD;                Location: [facility] ORS;  Service: Ophthalmology;  Laterality:              Left;  Lot # [id number] H [place]: 00:28.6 AP%: 10.8 CDE:               3.10  01/13/2018: CATARACT EXTRACTION W/PHACO; Right     Comment:  Procedure: CATARACT EXTRACTION PHACO AND INTRAOCULAR               LENS PLACEMENT (IOC);  Surgeon: Eulogio Bear, MD;                Location: [facility] ORS;  Service: Ophthalmology;  Laterality:              Right;  cassette lot #   [id number]  [place]   00:29.4 AP%                 9.6 CDE2.82 No date: TONSILLECTOMY No date: TUBAL LIGATION BMI    Body Mass Index:  35.68 kg/m     Reproductive/Obstetrics negative OB ROS                             Anesthesia Physical Anesthesia Plan  ASA: III  Anesthesia Plan: General   Post-op Pain Management:    Induction:   PONV Risk Score and Plan:   Airway Management Planned:   Additional Equipment:   Intra-op Plan:   Post-operative Plan:   Informed Consent: I have reviewed the patients History and Physical, chart, labs and discussed the procedure including the risks, benefits and alternatives for the proposed anesthesia with the patient or authorized representative who has indicated his/her understanding and acceptance.   Dental Advisory Given  Plan Discussed with: CRNA  Anesthesia Plan Comments:         Anesthesia Quick Evaluation

## 2018-08-30 ENCOUNTER — Encounter: Payer: Self-pay | Admitting: Unknown Physician Specialty

## 2018-09-06 NOTE — Anesthesia Postprocedure Evaluation (Signed)
Anesthesia Post Note  Patient: Emma Torres  Procedure(s) Performed: COLONOSCOPY WITH PROPOFOL (N/A )  Patient location during evaluation: PACU Anesthesia Type: General Level of consciousness: awake and alert Pain management: pain level controlled Vital Signs Assessment: post-procedure vital signs reviewed and stable Respiratory status: spontaneous breathing, nonlabored ventilation, respiratory function stable and patient connected to nasal cannula oxygen Cardiovascular status: blood pressure returned to baseline and stable Postop Assessment: no apparent nausea or vomiting Anesthetic complications: no     Last Vitals:  Vitals:   08/29/18 1253 08/29/18 1303  BP: 128/70 133/74  Pulse: 69 69  Resp: 17 17  Temp:    SpO2: 100% 100%    Last Pain:  Vitals:   08/30/18 0745  TempSrc:   PainSc: 0-No pain                 Molli Barrows

## 2018-10-05 ENCOUNTER — Ambulatory Visit
Admission: RE | Admit: 2018-10-05 | Discharge: 2018-10-05 | Disposition: A | Discharge status: Home / Self Care | Payer: Medicare Other | Source: Ambulatory Visit | Attending: Internal Medicine | Admitting: Internal Medicine

## 2018-10-05 DIAGNOSIS — Z1231 Encounter for screening mammogram for malignant neoplasm of breast: Secondary | ICD-10-CM | POA: Diagnosis not present

## 2019-08-30 ENCOUNTER — Other Ambulatory Visit: Payer: Self-pay | Admitting: Internal Medicine

## 2019-08-30 DIAGNOSIS — Z1231 Encounter for screening mammogram for malignant neoplasm of breast: Secondary | ICD-10-CM

## 2019-10-10 ENCOUNTER — Ambulatory Visit
Admission: RE | Admit: 2019-10-10 | Discharge: 2019-10-10 | Disposition: A | Discharge status: Home / Self Care | Payer: Medicare Other | Source: Ambulatory Visit | Attending: Internal Medicine | Admitting: Internal Medicine

## 2019-10-10 DIAGNOSIS — Z1231 Encounter for screening mammogram for malignant neoplasm of breast: Secondary | ICD-10-CM | POA: Insufficient documentation

## 2020-02-11 IMAGING — MG DIGITAL SCREENING BILAT W/ TOMO W/ CAD
6 of 10 series · 6 of 30 positions shown · non-contrast
Comparison: Previous exam(s).

CLINICAL DATA: Screening.

EXAM:
DIGITAL SCREENING BILATERAL MAMMOGRAM WITH TOMO AND CAD

[L CC synth-2D (1 of 2)]
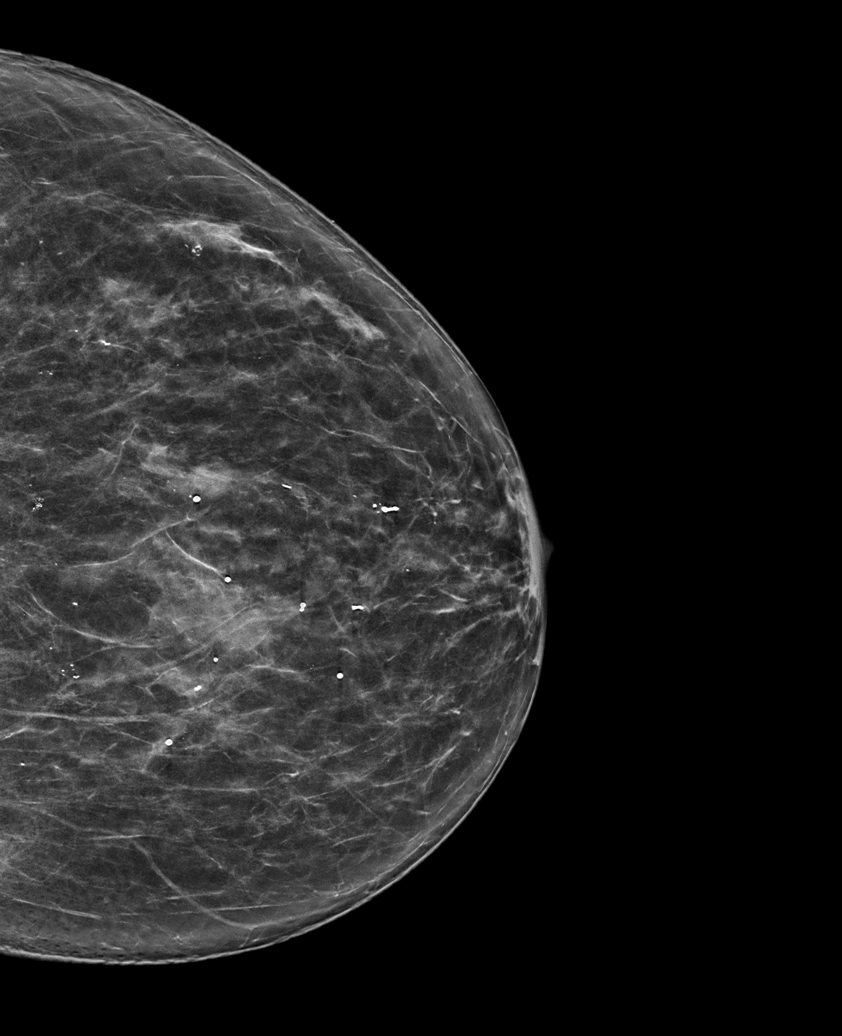

[R MLO synth-2D]
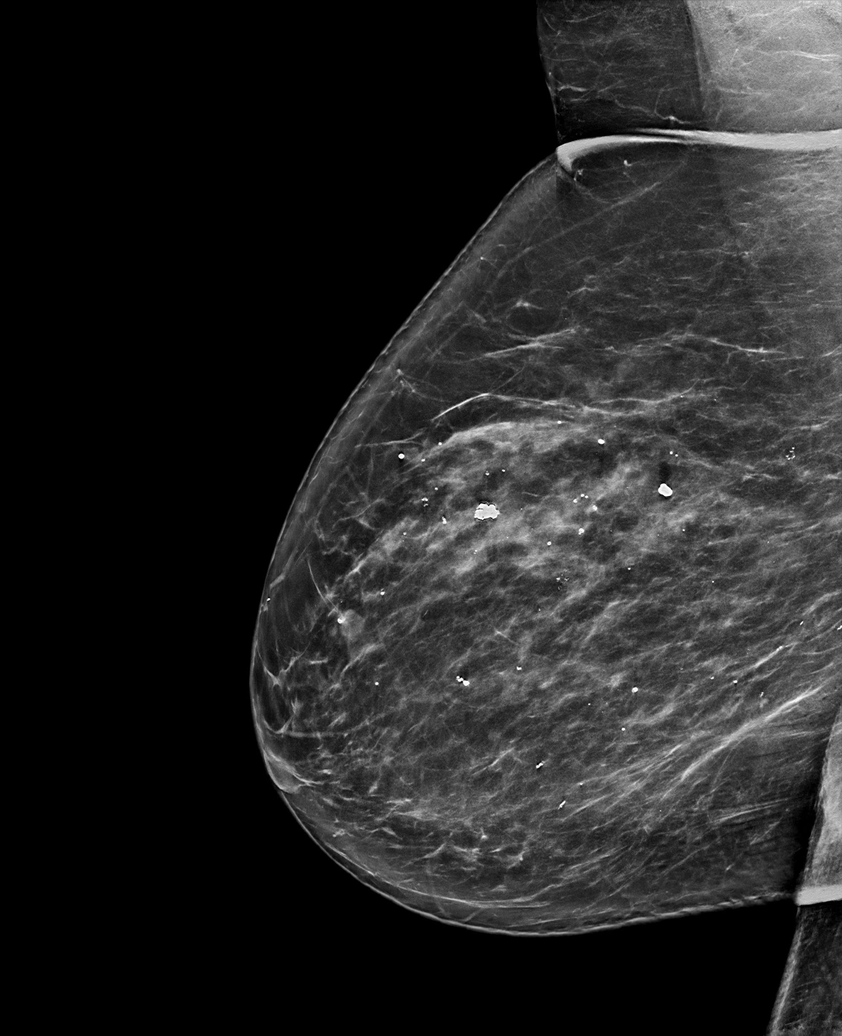

[L MLO synth-2D]
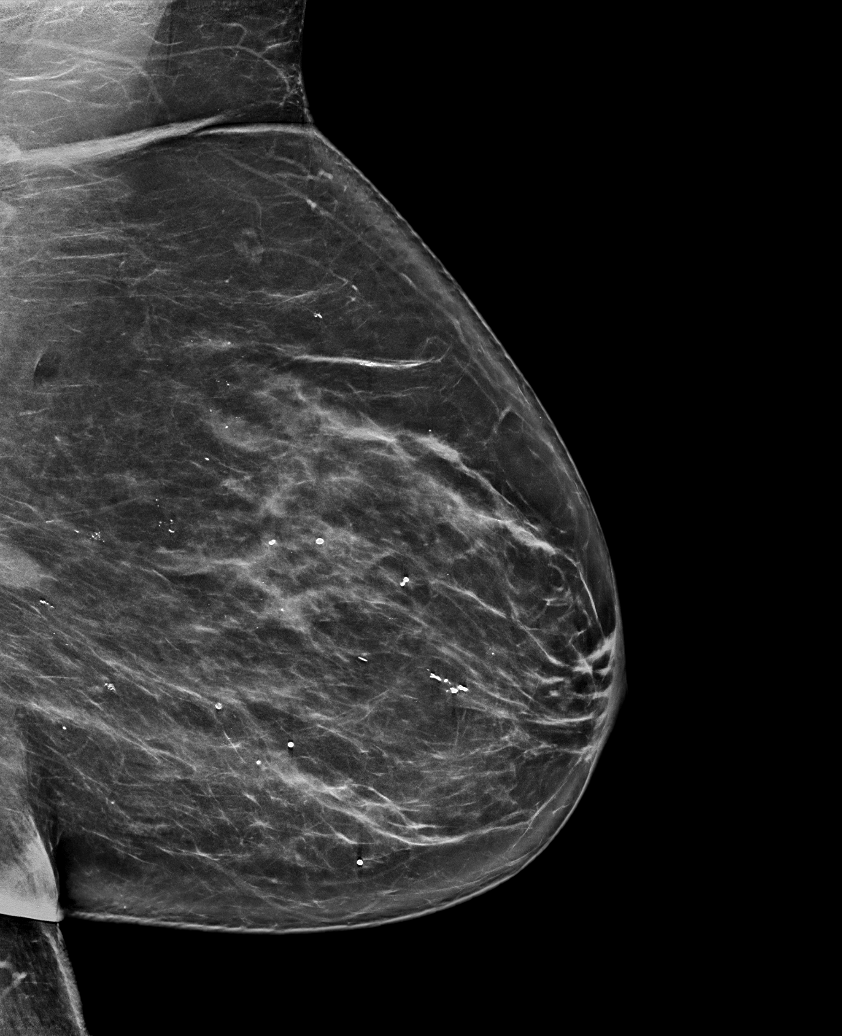

[L CC synth-2D (2 of 2)]
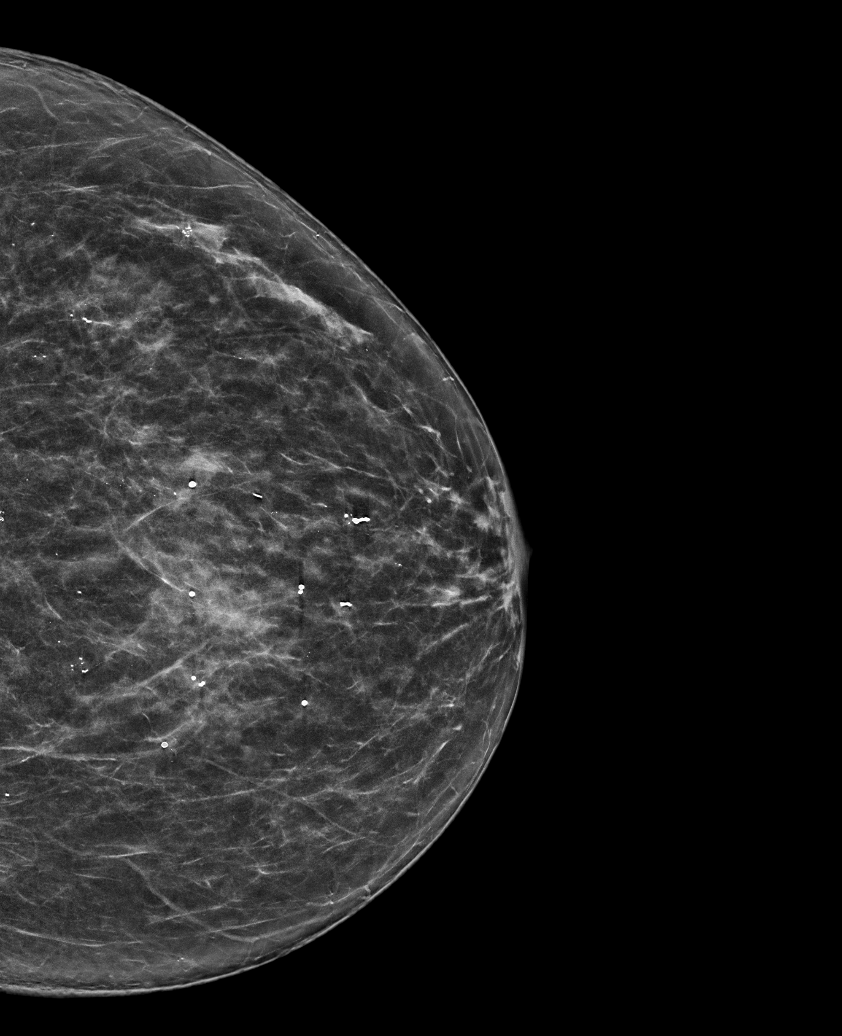

[R CC synth-2D]
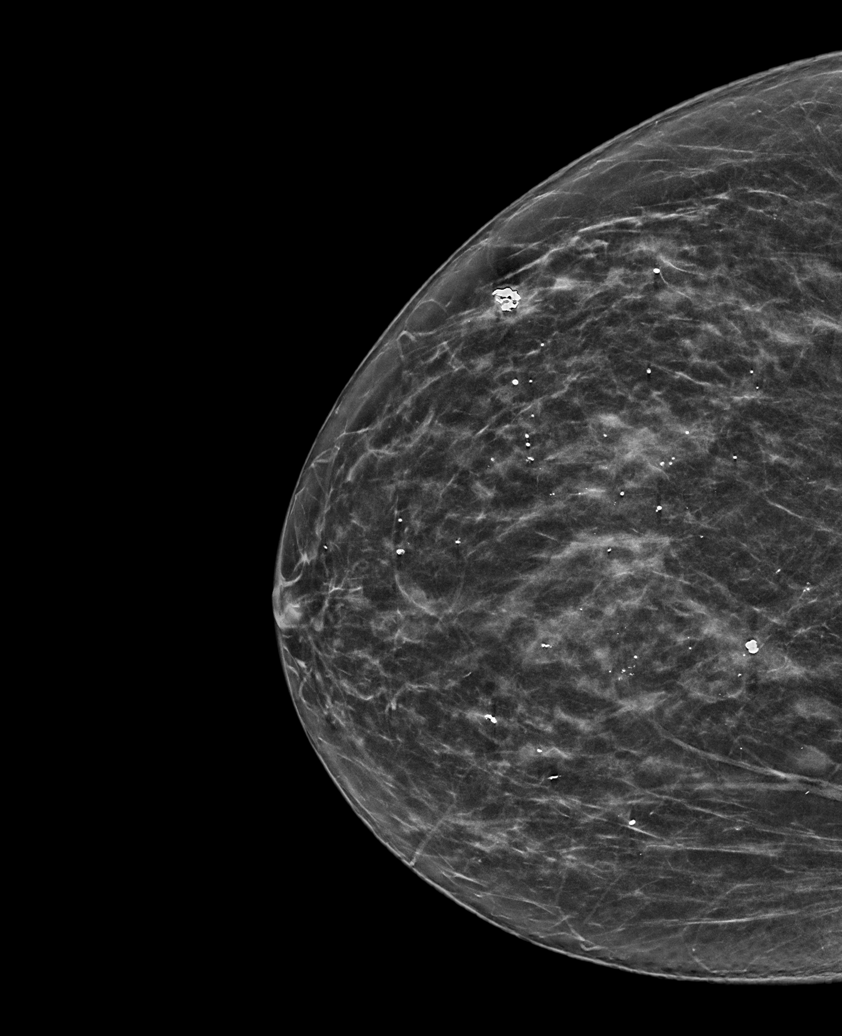

[R MLO tomo · tomo slice 43/84.0]
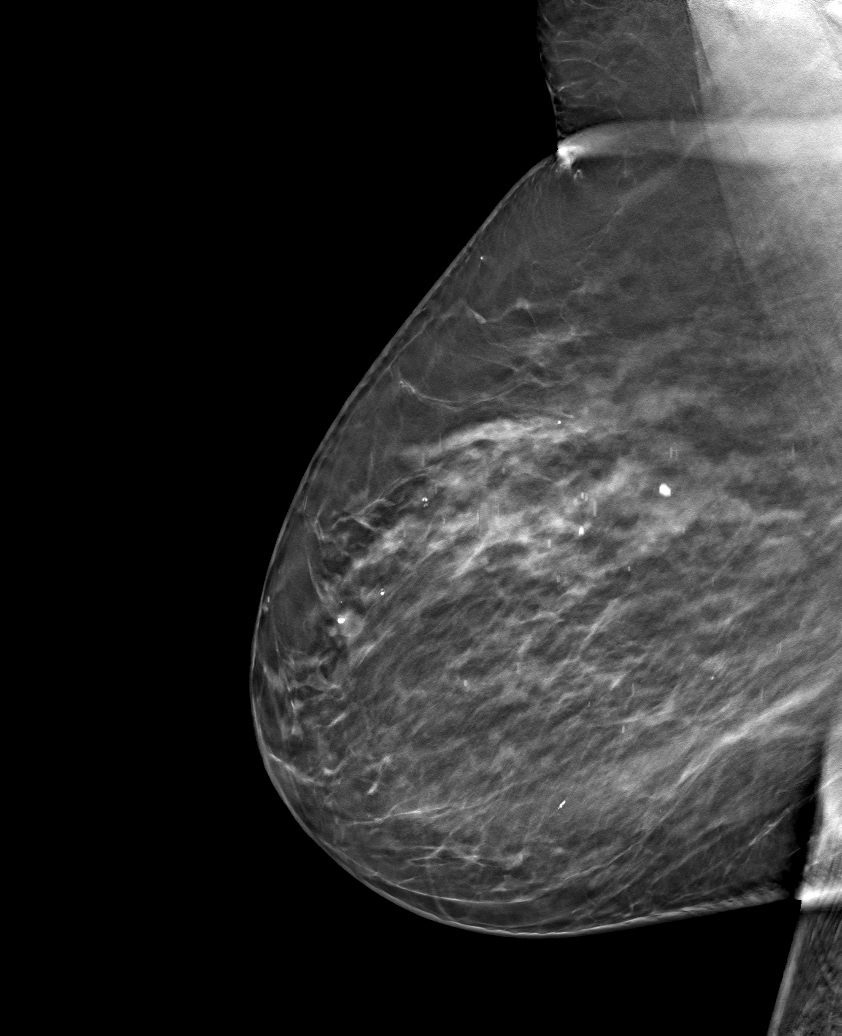

[6 of 30 positions shown; findings below may reference images not displayed]

ACR Breast Density Category c: The breast tissue is heterogeneously
dense, which may obscure small masses.
FINDINGS: There are no findings suspicious for malignancy. Images were
processed with CAD.
IMPRESSION: No mammographic evidence of malignancy. A result letter of this
screening mammogram will be mailed directly to the patient.

RECOMMENDATION:
Screening mammogram in one year. (Code:[id number])

BI-RADS CATEGORY  1: Negative.

## 2020-03-12 ENCOUNTER — Other Ambulatory Visit: Payer: Self-pay | Admitting: Internal Medicine

## 2020-03-12 DIAGNOSIS — N644 Mastodynia: Secondary | ICD-10-CM

## 2020-03-13 ENCOUNTER — Other Ambulatory Visit: Payer: Self-pay | Admitting: Internal Medicine

## 2020-03-13 DIAGNOSIS — N644 Mastodynia: Secondary | ICD-10-CM

## 2020-03-19 ENCOUNTER — Ambulatory Visit
Admission: RE | Admit: 2020-03-19 | Discharge: 2020-03-19 | Disposition: A | Discharge status: Home / Self Care | Payer: Medicare Other | Source: Ambulatory Visit | Attending: Internal Medicine | Admitting: Internal Medicine

## 2020-03-19 DIAGNOSIS — N644 Mastodynia: Secondary | ICD-10-CM | POA: Insufficient documentation

## 2020-03-20 ENCOUNTER — Other Ambulatory Visit: Payer: Self-pay | Admitting: Internal Medicine

## 2020-03-20 DIAGNOSIS — N6320 Unspecified lump in the left breast, unspecified quadrant: Secondary | ICD-10-CM

## 2020-09-19 ENCOUNTER — Other Ambulatory Visit: Payer: Self-pay | Admitting: Internal Medicine

## 2020-09-19 DIAGNOSIS — N644 Mastodynia: Secondary | ICD-10-CM

## 2020-10-14 ENCOUNTER — Ambulatory Visit
Admission: RE | Admit: 2020-10-14 | Discharge: 2020-10-14 | Disposition: A | Discharge status: Home / Self Care | Payer: Medicare Other | Source: Ambulatory Visit | Attending: Internal Medicine | Admitting: Internal Medicine

## 2020-10-14 ENCOUNTER — Other Ambulatory Visit: Payer: Self-pay

## 2020-10-14 DIAGNOSIS — N644 Mastodynia: Secondary | ICD-10-CM | POA: Insufficient documentation

## 2020-10-14 DIAGNOSIS — N6320 Unspecified lump in the left breast, unspecified quadrant: Secondary | ICD-10-CM | POA: Diagnosis present

## 2020-10-16 ENCOUNTER — Other Ambulatory Visit: Payer: Self-pay | Admitting: Internal Medicine

## 2020-10-16 DIAGNOSIS — N6320 Unspecified lump in the left breast, unspecified quadrant: Secondary | ICD-10-CM

## 2020-10-16 DIAGNOSIS — R928 Other abnormal and inconclusive findings on diagnostic imaging of breast: Secondary | ICD-10-CM

## 2021-04-17 ENCOUNTER — Ambulatory Visit
Admission: RE | Admit: 2021-04-17 | Discharge: 2021-04-17 | Disposition: A | Discharge status: Home / Self Care | Payer: Medicare Other | Source: Ambulatory Visit | Attending: Internal Medicine | Admitting: Internal Medicine

## 2021-04-17 ENCOUNTER — Other Ambulatory Visit: Payer: Self-pay

## 2021-04-17 DIAGNOSIS — R928 Other abnormal and inconclusive findings on diagnostic imaging of breast: Secondary | ICD-10-CM | POA: Diagnosis not present

## 2021-04-17 DIAGNOSIS — N6320 Unspecified lump in the left breast, unspecified quadrant: Secondary | ICD-10-CM | POA: Insufficient documentation

## 2021-08-19 IMAGING — US US BREAST*L* LIMITED INC AXILLA
1 series · 4 of 4 positions shown · non-contrast
Comparison: Previous exam(s).

CLINICAL DATA: 74-year-old female presenting for 1 year follow-up
of a probably benign left breast mass.

EXAM:
ULTRASOUND OF THE LEFT BREAST

[Series 1: us breast*left* limited inc axilla · 0.06mm/px · 4 of 4 slices shown]
[im 1/4]
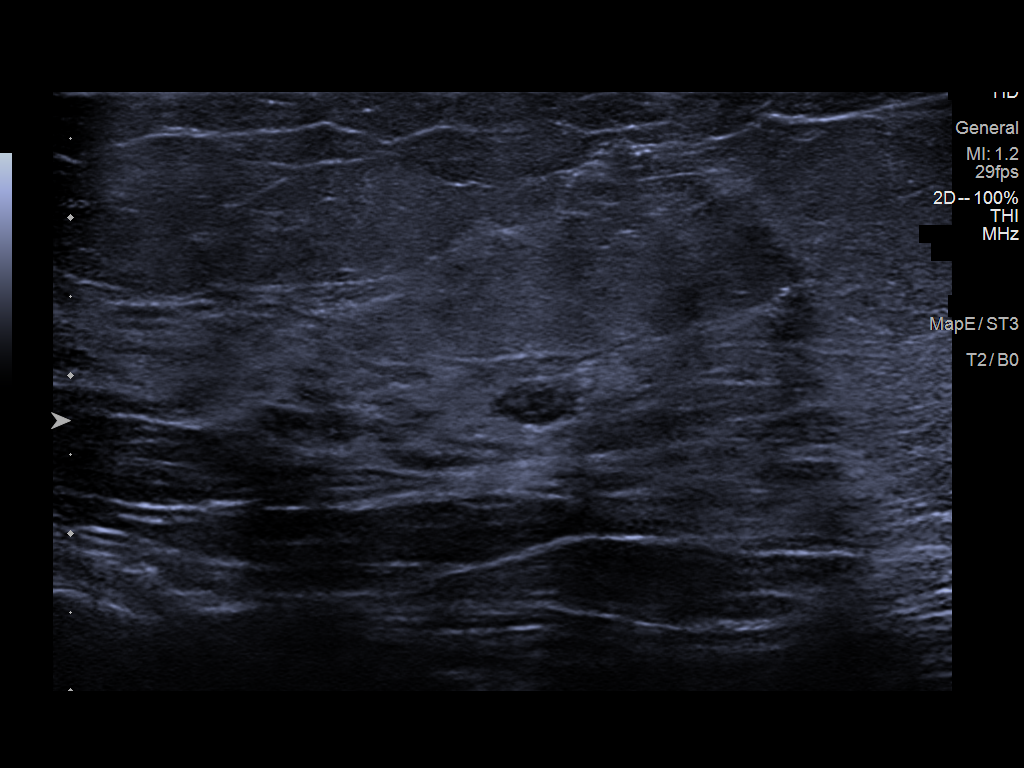
[im 2/4]
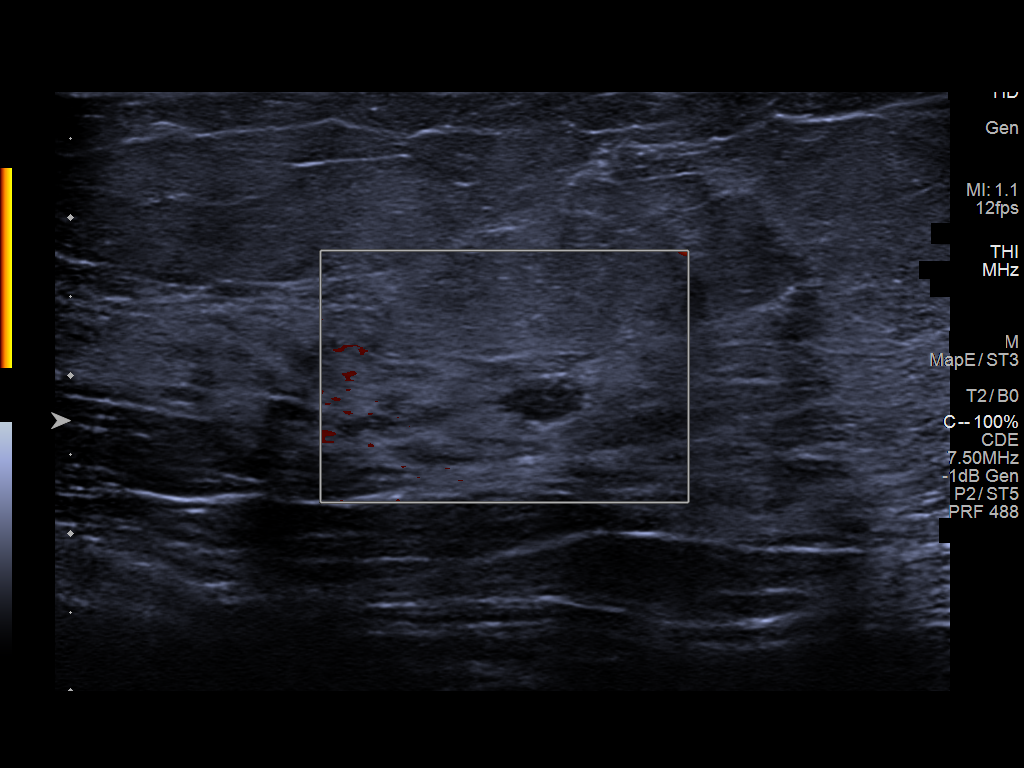
[im 3/4]
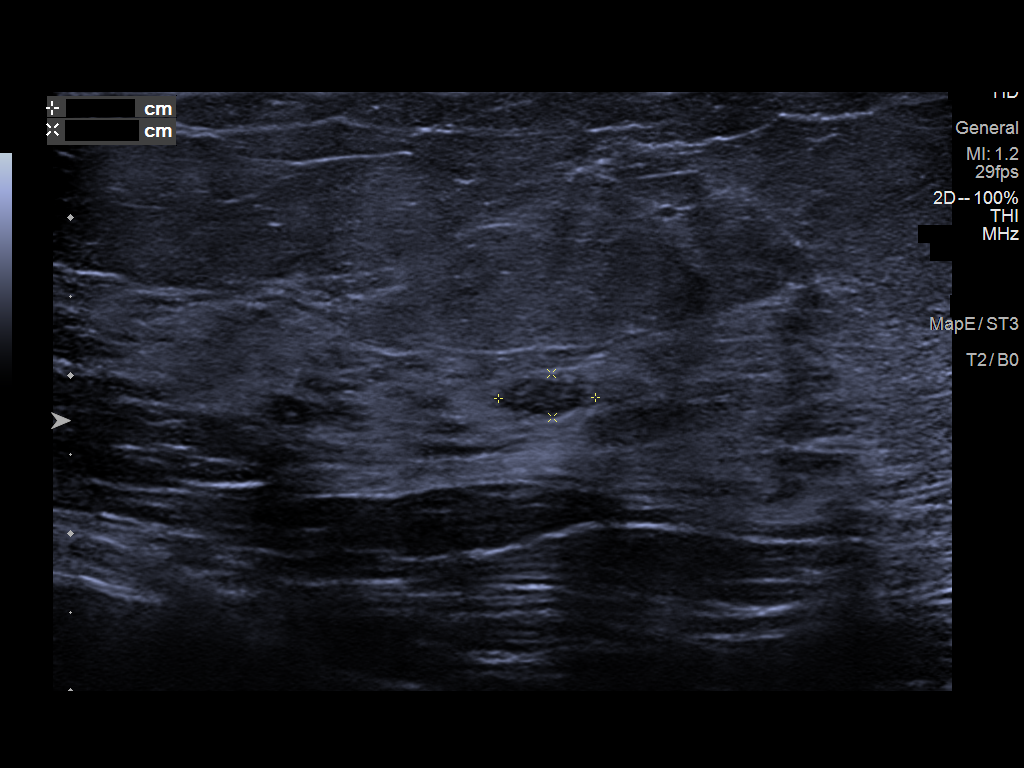
[im 4/4]
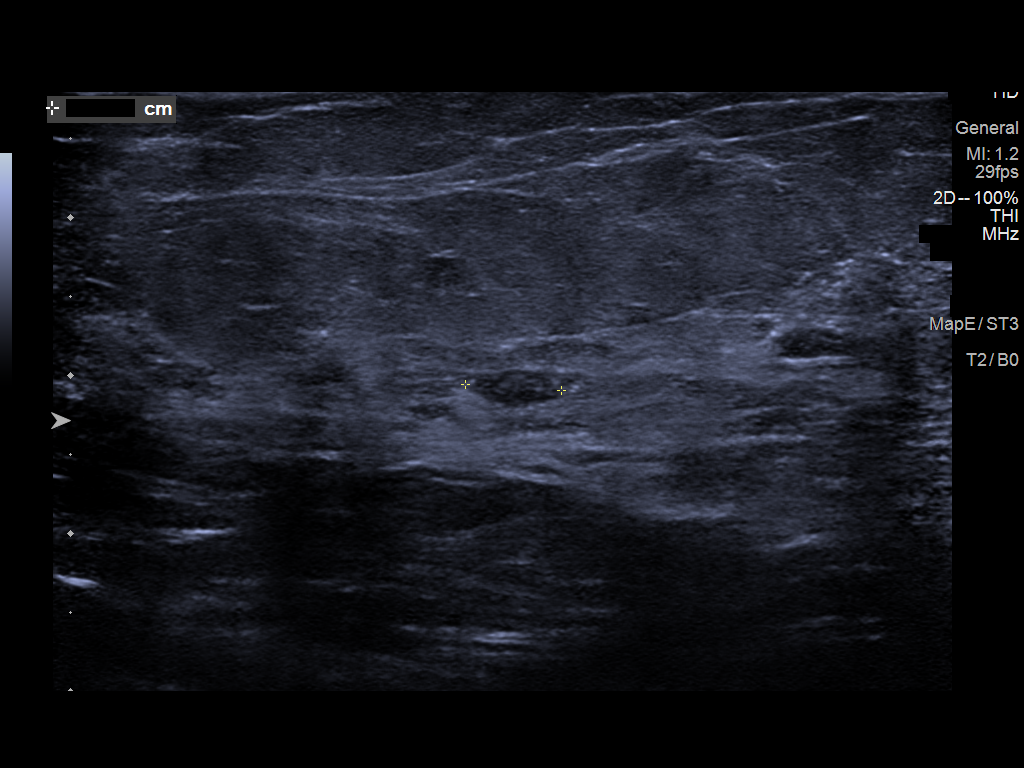

[4 of 4 positions shown; findings below may reference images not displayed]

FINDINGS: Targeted ultrasound is performed in the left breast at 12 o'clock 4
cm from the nipple demonstrating an oval circumscribed hypoechoic
mass measuring 0.6 x 0.3 x 0.6 cm, previously measuring 0.7 x 0.3 x
0.6 cm.
IMPRESSION: Stable probably benign mass in the left breast at 12 o'clock.

RECOMMENDATION:
1. Diagnostic bilateral mammogram in 6 months as patient will be due
for annual exam at that time.

2. Left breast ultrasound in 1 year to complete 2 year follow-up of
the probably benign left breast mass.

I have discussed the findings and recommendations with the patient.
If applicable, a reminder letter will be sent to the patient
regarding the next appointment.

BI-RADS CATEGORY  3: Probably benign.

## 2021-10-10 ENCOUNTER — Other Ambulatory Visit: Payer: Self-pay | Admitting: Family Medicine

## 2021-10-10 ENCOUNTER — Other Ambulatory Visit: Payer: Self-pay | Admitting: Internal Medicine

## 2021-10-10 DIAGNOSIS — N6320 Unspecified lump in the left breast, unspecified quadrant: Secondary | ICD-10-CM

## 2021-10-24 ENCOUNTER — Other Ambulatory Visit: Payer: Self-pay

## 2021-10-24 ENCOUNTER — Other Ambulatory Visit: Payer: Self-pay | Admitting: Internal Medicine

## 2021-10-24 ENCOUNTER — Ambulatory Visit
Admission: RE | Admit: 2021-10-24 | Discharge: 2021-10-24 | Disposition: A | Discharge status: Home / Self Care | Payer: Medicare Other | Source: Ambulatory Visit | Attending: Internal Medicine | Admitting: Internal Medicine

## 2021-10-24 DIAGNOSIS — N6320 Unspecified lump in the left breast, unspecified quadrant: Secondary | ICD-10-CM

## 2021-10-24 DIAGNOSIS — N6325 Unspecified lump in the left breast, overlapping quadrants: Secondary | ICD-10-CM | POA: Diagnosis present

## 2021-10-28 ENCOUNTER — Other Ambulatory Visit: Payer: Self-pay | Admitting: Internal Medicine

## 2021-10-28 DIAGNOSIS — N6320 Unspecified lump in the left breast, unspecified quadrant: Secondary | ICD-10-CM

## 2022-10-15 ENCOUNTER — Other Ambulatory Visit: Payer: Self-pay | Admitting: Internal Medicine

## 2022-10-15 DIAGNOSIS — N63 Unspecified lump in unspecified breast: Secondary | ICD-10-CM

## 2022-11-09 ENCOUNTER — Ambulatory Visit
Admission: RE | Admit: 2022-11-09 | Discharge: 2022-11-09 | Disposition: A | Discharge status: Home / Self Care | Payer: Medicare Other | Source: Ambulatory Visit

## 2022-11-09 ENCOUNTER — Ambulatory Visit
Admission: RE | Admit: 2022-11-09 | Discharge: 2022-11-09 | Disposition: A | Discharge status: Home / Self Care | Payer: Medicare Other | Source: Ambulatory Visit | Attending: Internal Medicine | Admitting: Internal Medicine

## 2022-11-09 ENCOUNTER — Other Ambulatory Visit: Payer: Self-pay | Admitting: Internal Medicine

## 2022-11-09 ENCOUNTER — Encounter: Payer: Self-pay | Admitting: Internal Medicine

## 2022-11-09 ENCOUNTER — Ambulatory Visit (HOSPITAL_COMMUNITY)
Admission: RE | Admit: 2022-11-09 | Discharge: 2022-11-09 | Disposition: A | Discharge status: Home / Self Care | Payer: Medicare Other | Source: Ambulatory Visit | Attending: Internal Medicine | Admitting: Internal Medicine

## 2022-11-09 DIAGNOSIS — N6325 Unspecified lump in the left breast, overlapping quadrants: Secondary | ICD-10-CM | POA: Insufficient documentation

## 2022-11-09 DIAGNOSIS — N63 Unspecified lump in unspecified breast: Secondary | ICD-10-CM | POA: Insufficient documentation

## 2022-11-09 DIAGNOSIS — N6310 Unspecified lump in the right breast, unspecified quadrant: Secondary | ICD-10-CM

## 2022-11-16 ENCOUNTER — Other Ambulatory Visit: Payer: Self-pay | Admitting: Internal Medicine

## 2022-11-16 DIAGNOSIS — R928 Other abnormal and inconclusive findings on diagnostic imaging of breast: Secondary | ICD-10-CM

## 2022-11-16 DIAGNOSIS — N63 Unspecified lump in unspecified breast: Secondary | ICD-10-CM

## 2022-11-20 DIAGNOSIS — C50911 Malignant neoplasm of unspecified site of right female breast: Secondary | ICD-10-CM

## 2022-11-20 HISTORY — DX: Malignant neoplasm of unspecified site of right female breast: C50.911

## 2022-11-30 ENCOUNTER — Ambulatory Visit
Admission: RE | Admit: 2022-11-30 | Discharge: 2022-11-30 | Disposition: A | Discharge status: Home / Self Care | Payer: Medicare Other | Source: Ambulatory Visit | Attending: Internal Medicine | Admitting: Internal Medicine

## 2022-11-30 DIAGNOSIS — N63 Unspecified lump in unspecified breast: Secondary | ICD-10-CM | POA: Insufficient documentation

## 2022-11-30 DIAGNOSIS — R928 Other abnormal and inconclusive findings on diagnostic imaging of breast: Secondary | ICD-10-CM | POA: Diagnosis present

## 2022-11-30 HISTORY — PX: BREAST CYST ASPIRATION: SHX578

## 2022-11-30 HISTORY — PX: BREAST BIOPSY: SHX20

## 2022-11-30 HISTORY — PX: OTHER SURGICAL HISTORY: SHX169

## 2022-11-30 MED ORDER — LIDOCAINE-EPINEPHRINE (PF) 1 %-1:200000 IJ SOLN
20.0000 mL | Freq: Once | INTRAMUSCULAR | Status: AC
  Administered 2022-11-30: 20 mL

## 2022-11-30 MED ORDER — LIDOCAINE HCL (PF) 1 % IJ SOLN
5.0000 mL | Freq: Once | INTRAMUSCULAR | Status: AC
  Administered 2022-11-30: 5 mL via INTRADERMAL

## 2022-11-30 MED ORDER — LIDOCAINE HCL (PF) 1 % IJ SOLN
5.0000 mL | Freq: Once | INTRAMUSCULAR | Status: AC
  Administered 2022-11-30: 5 mL

## 2022-12-01 ENCOUNTER — Encounter: Payer: Self-pay | Admitting: *Deleted

## 2022-12-01 DIAGNOSIS — C50919 Malignant neoplasm of unspecified site of unspecified female breast: Secondary | ICD-10-CM

## 2022-12-01 LAB — SURGICAL PATHOLOGY

## 2022-12-01 NOTE — Progress Notes (Signed)
Received referral for newly diagnosed breast cancer from [facility].  Navigation initiated.  Emma Torres does not want to see the surgeon or medical oncologist until after the new year.  She will see Dr. [name] on 12/22/22 and Dr. Tasia Catchings on 12/23/22.   I will call her when her ER/PR/HER2 comes back and let her know the results.

## 2022-12-02 ENCOUNTER — Encounter: Payer: Self-pay | Admitting: *Deleted

## 2022-12-02 NOTE — Progress Notes (Signed)
Called patient to let her know the receptors on her biopsy have come back.  Explained to her what ER +, PR+, and Her2 negative meant.   She would still like to wait to be seen until after the 1st of the year.  Her appointments are scheduled with med onc and surgeon.

## 2022-12-22 ENCOUNTER — Ambulatory Visit: Payer: Self-pay | Admitting: Surgery

## 2022-12-22 ENCOUNTER — Ambulatory Visit: Payer: Medicare Other | Admitting: Oncology

## 2022-12-22 ENCOUNTER — Other Ambulatory Visit: Payer: Self-pay | Admitting: Surgery

## 2022-12-22 ENCOUNTER — Other Ambulatory Visit: Payer: Medicare Other

## 2022-12-22 DIAGNOSIS — Z170 Estrogen receptor positive status [ER+]: Secondary | ICD-10-CM

## 2022-12-22 NOTE — H&P (Signed)
Subjective:   CC: Malignant neoplasm of upper-outer quadrant of right breast in female, estrogen receptor positive  [C50.411, Z17.0] HPI:  Emma Torres is a 77 y.o. female who was referred by Velna Ochs, MD for evaluation of above. Change was noted on last screening mammogram. Previous hx of biopsy in same breast decades ago.  Benign.  Asymptomatic.  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  Past Surgical History:  has a past surgical history that includes Tonsillectomy; Tubal ligation; Hysterectomy; Right shoulder dislocation; Cataract extraction w/  intraocular lens implant (Left, 10/2017); Cataract extraction w/  intraocular lens implant (Right, 12/2017); Colonoscopy (11/07/2004); Colonoscopy (07/18/2008, 09/22/2013); and Colonoscopy (08/29/2018).  Family History: family history includes Breast cancer in an other family member; Colon polyps in her brother, father, and mother; Diabetes in an other family member; Diabetes type II in her father; Heart failure in her mother; High blood pressure (Hypertension) in her father, mother, and another family member; Prostate cancer in her father; Stroke in an other family member; Uterine cancer in an other family member.  Social History:  reports that she has never smoked. She has never used smokeless tobacco. She reports that she does not drink alcohol and does not use drugs.  Current Medications: has a current medication list which includes the following prescription(s): acetaminophen, aspirin, calcium carbonate-vitamin d3, cyanocobalamin, glipizide, herbal supplement, januvia, lisinopril-hydrochlorothiazide, metformin, metoprolol succinate, multivitamin, onetouch ultra blue test strip, onetouch ultra2 meter, pravastatin, and lancing device with lancets.  Allergies:  Allergies as  of 12/22/2022 - Reviewed 12/22/2022  Allergen Reaction Noted   Adhesive Rash 11/01/2017    ROS:  A 15 point review of systems was performed and was negative except as noted in HPI   Objective:     BP (!) 160/72   Pulse 68   Ht 167.6 cm (_0 )   Wt 97.5 kg (215 lb)   BMI 34.70 kg/m   Constitutional :  No distress, cooperative, alert  Lymphatics/Throat:  Supple with no lymphadenopathy  Respiratory:  Clear to auscultation bilaterally  Cardiovascular:  Regular rate and rhythm  Gastrointestinal: Soft, non-tender, non-distended, no organomegaly.  Musculoskeletal: Steady gait and movement  Skin: Cool and moist,  Psychiatric: Normal affect, non-agitated, not confused  Breast: Normal appearance and no palpable abnormality in bilateral breasts and axilla.  Chaperone present for exam.      LABS:  Reason for Addendum #1:  Breast Biomarker Results   Specimen Submitted:  A. Breast, right   Clinical History: Subtle distortion in posterior upper right breast seen  at mammography with no [place] correlate.       DIAGNOSIS:  A. 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  - IN SITU CARCINOMA, DEFINITIVE CLASSIFICATION DEFERRED TO EXCISION.  Size of invasive carcinoma: 7 mm in this sample  Histologic grade of invasive carcinoma: Grade 2                       Glandular/tubular differentiation score: 3                       Nuclear pleomorphism score: 2                       Mitotic rate score: 1  Total score: 6  Lymphovascular invasion: Not identified   Comment:  The definitive grade will be assigned on the excisional specimen.  ER/PR/HER2: Immunohistochemistry will be performed on block A3, with  reflex to [place] for HER2 2+. The results will be reported in an addendum.   GROSS DESCRIPTION:  A. Labeled: Right breast stereo distortion subtle posterior upper right  Received:  in a formalin-filled [place] collection device  Specimen radiograph image(s) available for review  Time/Date in fixative: Collected at 8:21 AM on 11/30/2022 and placed in  formalin at 8:24 AM on 11/30/2022  Cold ischemic time: Less than 5 minutes  Total fixation time: Approximately 11.75 hours  Core pieces: Multiple  Measurement: Aggregate, 7 x 1 x 0.3 cm  Description / comments: Received are cores and fragments of yellow  fibrofatty tissue.  A diagram is not received on the container or with  the requisition.  Inked: Blue  Entirely submitted in cassette(s):   1 - sections A and B  2 - sections C and D  3 - sections E and F  4 - sections G and H  5 - sections I and J  6 - sections K and L with remaining free-floating fragments   RB 11/30/2022   Final Diagnosis performed by Quay Burow, MD.   Electronically signed  12/01/2022 2:19:03PM  The electronic signature indicates that the named Attending Pathologist  has evaluated the specimen  Technical component performed at [facility], [address], [place],  [place] [address] Lab: [phone] Dir: Rush Farmer, MD, MMM   Professional component performed at [facility], [facility], [place], [place], [place] [address] Lab: [phone]  Dir: Kathi Simpers, MD   ADDENDUM:  CASE SUMMARY: BREAST BIOMARKER TESTS  Estrogen Receptor (ER) Status: POSITIVE          Percentage of cells with nuclear positivity: Greater than 90%          Average intensity of staining: Strong   Progesterone Receptor (PgR) Status: POSITIVE          Percentage of cells with nuclear positivity: Greater than 90%          Average intensity of staining: Strong   HER2 (by immunohistochemistry): NEGATIVE (Score 0)   Ki-67: Not performed   Cold Ischemia and Fixation Times: Meet requirements specified in latest  version of the ASCO/CAP guidelines  Testing Performed on Block Number(s): A3   METHODS  Fixative: Formalin  Estrogen  Receptor:  FDA cleared ([place]) Primary Antibody:  SP1  Progesterone Receptor: FDA cleared ([place]) Primary Antibody: 1E2  HER2 (by IHC): FDA approved ([place]) Primary Antibody: 4B5 ([facility])  Immunohistochemistry controls worked appropriately. Slides were prepared  by Launa Grill, [place], and interpreted by Quay Burow, MD.  (v1.5.0.1)      RADS: CLINICAL DATA:  BI-RADS 3 follow-up LEFT breast mass at 12 o'clock  noted sonographically, initiated March 2021   EXAM:  DIGITAL DIAGNOSTIC BILATERAL MAMMOGRAM WITH TOMOSYNTHESIS;  ULTRASOUND LEFT BREAST LIMITED; ULTRASOUND RIGHT BREAST LIMITED   TECHNIQUE:  Bilateral digital diagnostic mammography and breast tomosynthesis  was performed.; Targeted ultrasound examination of the left breast  was performed.; Targeted ultrasound examination of the right breast  was performed   COMPARISON:  Previous exam(s).   ACR Breast Density Category c: The breast tissue is heterogeneously  dense, which may obscure small masses.   FINDINGS:  Diagnostic images of the RIGHT breast demonstrate an area of subtle  possible architectural distortion in the RIGHT upper breast  at  posterior depth. It is best seen on CC slice 37 and spot CC slice  30. It is immediately adjacent to a coarse calcification. There is a  possible correlate noted on ML slice 37. Patient has a history of  excisional biopsy in the lower aspect of her breast.   Diagnostic images of the LEFT breast demonstrate an oval mass in the  LEFT outer breast at middle depth. This is best seen on CC volume 1  slice 46 and CC volume 2 slice 45. This is not definitively stable  in comparison to priors. There is a stable oval circumscribed mass  noted on MLO imaging in the far posterior LEFT breast. Definitive  mammographic correlate for previously described mass at 12 o'clock  is not identified.   Targeted ultrasound was performed of the RIGHT upper breast. No  definitive correlate for  area of possible subtle architectural  distortion is identified. No suspicious cystic or solid mass is  seen.   Targeted ultrasound was performed of the LEFT upper breast. At 12  o'clock 4 cm from nipple, there is revisualization of an oval  circumscribed hypoechoic mass. It measures 6 x 2 by 6 mm, stable for  greater than 2 years and consistent with a benign etiology.   Targeted ultrasound performed of the LEFT outer breast. At 3 o'clock  2 cm from the nipple, there is an oval circumscribed hypoechoic  mass. It measures 14 x 7 x 11 mm and is favored to correspond to the  mass noted mammographically.   IMPRESSION:  1. There is an area of possible subtle architectural distortion in  the RIGHT upper breast at posterior depth. Recommend stereotactic  guided biopsy for definitive characterization.  2. There is a 14 mm mass in the LEFT breast at 3 o'clock 2 cm from  the nipple. This likely reflects a complicated cyst. Given  hypoechoic appearance on today's exam, recommend ultrasound-guided  aspiration for definitive characterization.  3. Stable LEFT breast mass at 12 o'clock for greater than 2 years,  consistent with a benign etiology.   RECOMMENDATION:  RIGHT breast stereotactic guided biopsy x1   LEFT breast ultrasound-guided aspiration with potential conversion  to biopsy x1   I have discussed the findings and recommendations with the patient.  The biopsy procedure was discussed with the patient and questions  were answered. Patient expressed their understanding of the biopsy  recommendation. Patient will be scheduled for biopsy at her earliest  convenience by the schedulers. Ordering provider will be notified.  If applicable, a reminder letter will be sent to the patient  regarding the next appointment.   BI-RADS CATEGORY  4: Suspicious.   Assessment:   Malignant neoplasm of upper-outer quadrant of right breast in female, estrogen receptor positive  [C50.411,  Z17.0]  Plan:     1. Malignant neoplasm of upper-outer quadrant of right breast in female, estrogen receptor positive  [C50.411, Z17.0]  Discussed the risk of surgery including recurrence, chronic pain, post-op infxn, poor/delayed wound healing, poor cosmesis, seroma, hematoma formation, and possible re-operation to address said risks. The risks of general anesthetic, if used, includes MI, CVA, sudden death or even reaction to anesthetic medications also discussed.  Typical post-op recovery time and possbility of activity restrictions were also discussed.  Alternatives include continued observation.  Benefits include possible symptom relief, pathologic evaluation, and/or curative excision.   The patient verbalized understanding and all questions were answered to the patient's satisfaction.  2. Patient has elected to proceed  with surgical treatment. Procedure will be scheduled.  Right lumpectomy, SLNB. Localizer placement.  labs/images/medications/previous chart entries reviewed personally and relevant changes/updates noted above.

## 2022-12-23 ENCOUNTER — Encounter: Payer: Self-pay | Admitting: *Deleted

## 2022-12-23 ENCOUNTER — Encounter: Payer: Self-pay | Admitting: Oncology

## 2022-12-23 ENCOUNTER — Other Ambulatory Visit: Payer: Self-pay | Admitting: Surgery

## 2022-12-23 ENCOUNTER — Inpatient Hospital Stay: Payer: Medicare Other

## 2022-12-23 ENCOUNTER — Inpatient Hospital Stay: Payer: Medicare Other | Attending: Oncology | Admitting: Oncology

## 2022-12-23 VITALS — BP 164/64 | HR 59 | Temp 96.7°F | Wt 194.2 lb

## 2022-12-23 DIAGNOSIS — I129 Hypertensive chronic kidney disease with stage 1 through stage 4 chronic kidney disease, or unspecified chronic kidney disease: Secondary | ICD-10-CM | POA: Diagnosis not present

## 2022-12-23 DIAGNOSIS — C50919 Malignant neoplasm of unspecified site of unspecified female breast: Secondary | ICD-10-CM | POA: Diagnosis not present

## 2022-12-23 DIAGNOSIS — Z79899 Other long term (current) drug therapy: Secondary | ICD-10-CM | POA: Diagnosis not present

## 2022-12-23 DIAGNOSIS — N183 Chronic kidney disease, stage 3 unspecified: Secondary | ICD-10-CM | POA: Insufficient documentation

## 2022-12-23 DIAGNOSIS — N1831 Chronic kidney disease, stage 3a: Secondary | ICD-10-CM | POA: Diagnosis not present

## 2022-12-23 DIAGNOSIS — Z7982 Long term (current) use of aspirin: Secondary | ICD-10-CM | POA: Insufficient documentation

## 2022-12-23 DIAGNOSIS — C50411 Malignant neoplasm of upper-outer quadrant of right female breast: Secondary | ICD-10-CM

## 2022-12-23 DIAGNOSIS — E1122 Type 2 diabetes mellitus with diabetic chronic kidney disease: Secondary | ICD-10-CM | POA: Insufficient documentation

## 2022-12-23 DIAGNOSIS — Z7189 Other specified counseling: Secondary | ICD-10-CM

## 2022-12-23 DIAGNOSIS — C50912 Malignant neoplasm of unspecified site of left female breast: Secondary | ICD-10-CM | POA: Diagnosis present

## 2022-12-23 DIAGNOSIS — Z7984 Long term (current) use of oral hypoglycemic drugs: Secondary | ICD-10-CM | POA: Diagnosis not present

## 2022-12-23 DIAGNOSIS — Z809 Family history of malignant neoplasm, unspecified: Secondary | ICD-10-CM | POA: Diagnosis not present

## 2022-12-23 DIAGNOSIS — Z90710 Acquired absence of both cervix and uterus: Secondary | ICD-10-CM | POA: Diagnosis not present

## 2022-12-23 LAB — CBC WITH DIFFERENTIAL/PLATELET
Abs Immature Granulocytes: 0.03 10*3/uL (ref 0.00–0.07)
Basophils Absolute: 0 10*3/uL (ref 0.0–0.1)
Basophils Relative: 0 %
Eosinophils Absolute: 0.1 10*3/uL (ref 0.0–0.5)
Eosinophils Relative: 1 %
HCT: 41.7 % (ref 36.0–46.0)
Hemoglobin: 13.6 g/dL (ref 12.0–15.0)
Immature Granulocytes: 1 %
Lymphocytes Relative: 37 %
Lymphs Abs: 2.1 10*3/uL (ref 0.7–4.0)
MCH: 27.2 pg (ref 26.0–34.0)
MCHC: 32.6 g/dL (ref 30.0–36.0)
MCV: 83.4 fL (ref 80.0–100.0)
Monocytes Absolute: 0.5 10*3/uL (ref 0.1–1.0)
Monocytes Relative: 8 %
Neutro Abs: 3.1 10*3/uL (ref 1.7–7.7)
Neutrophils Relative %: 53 %
Platelets: 231 10*3/uL (ref 150–400)
RBC: 5 MIL/uL (ref 3.87–5.11)
RDW: 14.5 % (ref 11.5–15.5)
WBC: 5.7 10*3/uL (ref 4.0–10.5)
nRBC: 0 % (ref 0.0–0.2)

## 2022-12-23 LAB — COMPREHENSIVE METABOLIC PANEL
ALT: 20 U/L (ref 0–44)
AST: 22 U/L (ref 15–41)
Albumin: 4.3 g/dL (ref 3.5–5.0)
Alkaline Phosphatase: 56 U/L (ref 38–126)
Anion gap: 10 (ref 5–15)
BUN: 17 mg/dL (ref 8–23)
CO2: 27 mmol/L (ref 22–32)
Calcium: 9.5 mg/dL (ref 8.9–10.3)
Chloride: 100 mmol/L (ref 98–111)
Creatinine, Ser: 1.06 mg/dL — ABNORMAL HIGH (ref 0.44–1.00)
GFR, Estimated: 54 mL/min — ABNORMAL LOW (ref >60)
Glucose, Bld: 185 mg/dL — ABNORMAL HIGH (ref 70–99)
Potassium: 4 mmol/L (ref 3.5–5.1)
Sodium: 137 mmol/L (ref 135–145)
Total Bilirubin: 0.9 mg/dL (ref 0.3–1.2)
Total Protein: 7.4 g/dL (ref 6.5–8.1)

## 2022-12-23 NOTE — Assessment & Plan Note (Signed)
Encourage oral hydration and avoid nephrotoxins.   

## 2022-12-23 NOTE — Progress Notes (Signed)
Patient is referred here by Dr. Wynetta Emery for breast cancer.

## 2022-12-23 NOTE — Assessment & Plan Note (Addendum)
Pathology and radiology counseling: Discussed with the patient, the details of pathology including the type of breast cancer,the clinical staging, the significance of ER, PR and HER-2/neu receptors and the implications for treatment. After reviewing the pathology in detail, we proceeded to discuss the different treatment options between surgery, radiation, chemotherapy, antiestrogen therapies.  Treatment plan: 1.  Bilateral breast MRI for further evaluation given that invasive carcinoma has lobular features. 2   Breast conserving surgery with sentinel lymph node biopsy-she has establish care with Dr. [name] 3.  If tumor is >32m, will send Oncotype DX testing  to determine if she would benefit from adjuvant chemo 4.  Adjuvant radiation 5.  Adjuvant antiestrogen therapy  Return to clinic based upon surgery pathology and Oncotype DX test result

## 2022-12-23 NOTE — Progress Notes (Addendum)
Hematology/Oncology Consult Note [phone] [phone]     REFERRING PROVIDER: Baxter Hire, MD   Patient Care Team: Baxter Hire, MD as PCP - General (Internal Medicine) Daiva Huge, RN as Oncology Nurse Navigator  CHIEF COMPLAINTS/PURPOSE OF CONSULTATION:  Right breast invasive mammary carcinoma.   ASSESSMENT & PLAN:   Invasive carcinoma of breast [facility]) Pathology and radiology counseling: Discussed with the patient, the details of pathology including the type of breast cancer,the clinical staging, the significance of ER, PR and HER-2/neu receptors and the implications for treatment. After reviewing the pathology in detail, we proceeded to discuss the different treatment options between surgery, radiation, chemotherapy, antiestrogen therapies.  Treatment plan: 1.  Bilateral breast MRI for further evaluation given that invasive carcinoma has lobular features. 2   Breast conserving surgery with sentinel lymph node biopsy-she has establish care with Dr. [name] 3.  If tumor is >34mm, will send Oncotype DX testing  to determine if she would benefit from adjuvant chemo 4.  Adjuvant radiation 5.  Adjuvant antiestrogen therapy  Return to clinic based upon surgery pathology and Oncotype DX test result   Family history of cancer Refer to genetic counselor for genetic testing.  Goals of care, counseling/discussion Discussed with patient.  CKD (chronic kidney disease) stage 3, GFR 30-59 ml/min (HCC) Encourage oral hydration and avoid nephrotoxins.    Check labs today, CBC CMP and tumor markers. Orders Placed This Encounter  Procedures   Cancer antigen 27.29    Standing Status:   Future    Number of Occurrences:   1    Standing Expiration Date:   12/24/2023   Cancer antigen 15-3    Standing Status:   Future    Number of Occurrences:   1    Standing Expiration Date:   12/24/2023   CBC with Differential/Platelet    Standing Status:   Future    Number  of Occurrences:   1    Standing Expiration Date:   12/24/2023   Comprehensive metabolic panel    Standing Status:   Future    Number of Occurrences:   1    Standing Expiration Date:   12/23/2023   Ambulatory referral to Genetics    Referral Priority:   Routine    Referral Type:   Consultation    Referral Reason:   Specialty Services Required    Number of Visits Requested:   1   Follow-up to be determined.  Plan to see patient 2 to 3 weeks after surgery. All questions were answered. The patient knows to call the clinic with any problems, questions or concerns.  Earlie Server, MD, PhD [facility] Hematology Oncology 12/23/2022   HISTORY OF PRESENTING ILLNESS:  Emma Torres 77 y.o. female presents to establish care for Right breast invasive mammary carcinoma I have reviewed her chart and materials related to her cancer extensively and collaborated history with the patient. Summary of oncologic history is as follows: Oncology History  Invasive carcinoma of breast ([place])  10/24/2021 Mammogram   Bilateral diagnostic mammogram 1.  Stable appearance of probably benign left breast mass. 2.  No findings of malignancy in either breast.   11/09/2022 Mammogram   Bilateral diagnostic mammogram and [place] bilateral breast  1. There is an area of possible subtle architectural distortion in the RIGHT upper breast at posterior depth. Recommend stereotactic guided biopsy for definitive characterization. 2. There is a 14 mm mass in the LEFT breast at 3 o'clock 2 cm rom the  nipple. This likely reflects a complicated cyst. Given hypoechoic appearance on today's exam, recommend ultrasound-guided aspiration for definitive characterization. 3. Stable LEFT breast mass at 12 o'clock for greater than 2 years, consistent with a benign etiology.   12/23/2022 Initial Diagnosis   Invasive carcinoma of breast  Patient gets mammogram surveillance for left breast mass which has been stable. Recent mammogram showed right  breast distortion.   11/30/2022 Right breast stereotactic biopsy showed Invasive mammary carcinoma with mixed lobular and ductal features, Grade 2, no LVI, ER 90%+, PR 90%+, HER2 - [facility] [place] [facility] 0]   Menarche at age of 89 First live birth at age of 61 OCP use: denies History of hysterectomy: yes Menopausal status: postmenopausal History of HRT use: 2 years History of chest radiation: denies Number of previous breast biopsies:  right breast.    12/23/2022 Cancer Staging   Staging form: Breast, AJCC 8th Edition - Clinical: Stage Unknown (cTX, cN0, cM0, G2, ER+, PR+, HER2-) - Signed by Earlie Server, MD on 12/23/2022 Histologic grading system: 3 grade system    Patient has positive family history of breast cancer and uterine cancer.  Today she was accompanied by daughter.  She has no new complaints .  MEDICAL HISTORY:  Past Medical History:  Diagnosis Date   Arthritis    Chronic kidney disease    Cough    chronic   Diabetes mellitus without complication (HCC)    Hypertension    IBS (irritable bowel syndrome)     SURGICAL HISTORY: Past Surgical History:  Procedure Laterality Date   ABDOMINAL HYSTERECTOMY     BREAST BIOPSY Right 11/30/2022   MM RT BREAST BX W LOC DEV 1ST LESION IMAGE BX SPEC STEREO GUIDE 11/30/2022 [facility]   breast biospy Right 11/30/2022   rt stereo asymetry, coil clip, path pending   BREAST CYST ASPIRATION Left 11/30/2022   BREAST EXCISIONAL BIOPSY Right 2011   negative   CATARACT EXTRACTION W/PHACO Left 11/18/2017   Procedure: CATARACT EXTRACTION PHACO AND INTRAOCULAR LENS PLACEMENT ([place]);  Surgeon: Eulogio Bear, MD;  Location: [facility] ORS;  Service: Ophthalmology;  Laterality: Left;  Lot # [id number] H [place]: 00:28.6 AP%: 10.8 CDE: 3.10    CATARACT EXTRACTION W/PHACO Right 01/13/2018   Procedure: CATARACT EXTRACTION PHACO AND INTRAOCULAR LENS PLACEMENT (I[facility]);  Surgeon: Eulogio Bear, MD;  Location: [facility] ORS;  Service: Ophthalmology;  Laterality: Right;   cassette lot #  [id number]  [place]   00:29.4 AP%   9.6 CDE2.82   COLONOSCOPY WITH PROPOFOL N/A 08/29/2018   Procedure: COLONOSCOPY WITH PROPOFOL;  Surgeon: Manya Silvas, MD;  Location: [facility] ENDOSCOPY;  Service: Endoscopy;  Laterality: N/A;   TONSILLECTOMY     TUBAL LIGATION      SOCIAL HISTORY: Social History   Socioeconomic History   Marital status: Married    Spouse name: Not on file   Number of children: Not on file   Years of education: Not on file   Highest education level: Not on file  Occupational History   Not on file  Tobacco Use   Smoking status: Never   Smokeless tobacco: Never  Vaping Use   Vaping Use: Never used  Substance and Sexual Activity   Alcohol use: No   Drug use: No   Sexual activity: Not on file  Other Topics Concern   Not on file  Social History Narrative   Not on file   Social Determinants of Health   Financial Resource Strain: Not on file  Food Insecurity: Not on  file  Transportation Needs: Not on file  Physical Activity: Not on file  Stress: Not on file  Social Connections: Not on file  Intimate Partner Violence: Not on file    FAMILY HISTORY: Family History  Problem Relation Age of Onset   Breast cancer Maternal Aunt        two aunts. 4's   Breast cancer Maternal Grandmother        70's    ALLERGIES:  is allergic to tape.  MEDICATIONS:  Current Outpatient Medications  Medication Sig Dispense Refill   acetaminophen (TYLENOL) 650 MG CR tablet Take by mouth.     aspirin EC 81 MG tablet Take 81 mg by mouth daily.     Biotin w/ Vitamins C & E (HAIR/SKIN/NAILS PO) Take 1 tablet by mouth daily.     Blood Glucose Monitoring Suppl (ONE TOUCH ULTRA 2) w/Device KIT See admin instructions.     Calcium Carbonate-Vitamin D (CALTRATE 600+D PO) Take 1 tablet by mouth daily.     cyanocobalamin (VITAMIN B12) 1000 MCG tablet Take by mouth.     glipiZIDE (GLUCOTROL) 10 MG tablet Take 20 mg by mouth 2 (two) times daily before a meal.       glucose blood (ONETOUCH ULTRA) test strip Use once daily E11.9     ibuprofen (ADVIL,MOTRIN) 200 MG tablet Take 200 mg by mouth every 6 (six) hours as needed for headache or moderate pain.     Lancets Misc. (UNISTIK 2 NORMAL) MISC Use 1 each once daily Use as instructed. One Touch Ultra lancets E11.9     lidocaine-prilocaine (EMLA) cream Apply topically.     lisinopril-hydrochlorothiazide (PRINZIDE,ZESTORETIC) 20-12.5 MG tablet Take 1 tablet by mouth daily.     Magnesium 250 MG TABS Take 250 mg by mouth daily.     metFORMIN (GLUCOPHAGE) 1000 MG tablet Take 1,000-1,500 mg by mouth See admin instructions. Take 1000 mg by mouth in the morning and take 1500 mg by mouth in the evening     metoprolol succinate (TOPROL-XL) 50 MG 24 hr tablet Take 50 mg by mouth daily. Take with or immediately following a meal.     pravastatin (PRAVACHOL) 80 MG tablet Take 80 mg by mouth daily.     sitaGLIPtin (JANUVIA) 50 MG tablet Take 50 mg by mouth daily.     No current facility-administered medications for this visit.    Review of Systems  Constitutional:  Negative for appetite change, chills, fatigue and fever.  HENT:   Negative for hearing loss and voice change.   Eyes:  Negative for eye problems.  Respiratory:  Negative for chest tightness and cough.   Cardiovascular:  Negative for chest pain.  Gastrointestinal:  Negative for abdominal distention, abdominal pain and blood in stool.  Endocrine: Negative for hot flashes.  Genitourinary:  Negative for difficulty urinating and frequency.   Musculoskeletal:  Negative for arthralgias.  Skin:  Negative for itching and rash.  Neurological:  Negative for extremity weakness.  Hematological:  Negative for adenopathy.  Psychiatric/Behavioral:  Negative for confusion.      PHYSICAL EXAMINATION: ECOG PERFORMANCE STATUS: 0 - Asymptomatic  Vitals:   12/23/22 0925  BP: (!) 164/64  Pulse: (!) 59  Temp: (!) 96.7 F (35.9 C)  SpO2: 100%   Filed Weights    12/23/22 0925  Weight: 194 lb 3.2 oz (88.1 kg)    Physical Exam Constitutional:      General: She is not in acute distress.    Appearance: She is  not diaphoretic.  HENT:     Head: Normocephalic and atraumatic.     Nose: Nose normal.     Mouth/Throat:     Pharynx: No oropharyngeal exudate.  Eyes:     General: No scleral icterus.    Pupils: Pupils are equal, round, and reactive to light.  Cardiovascular:     Rate and Rhythm: Normal rate and regular rhythm.     Heart sounds: No murmur heard. Pulmonary:     Effort: Pulmonary effort is normal. No respiratory distress.     Breath sounds: No rales.  Chest:     Chest wall: No tenderness.  Abdominal:     General: There is no distension.     Palpations: Abdomen is soft.     Tenderness: There is no abdominal tenderness.  Musculoskeletal:        General: Normal range of motion.     Cervical back: Normal range of motion and neck supple.  Skin:    General: Skin is warm and dry.     Findings: No erythema.  Neurological:     Mental Status: She is alert and oriented to person, place, and time.     Cranial Nerves: No cranial nerve deficit.     Motor: No abnormal muscle tone.     Coordination: Coordination normal.  Psychiatric:        Mood and Affect: Affect normal.    Breast exam was performed in seated and lying down position. Patient is status post right breast biopsy.  No palpable breast masses bilaterally.  No palpable axillary adenopathy bilaterally.   LABORATORY DATA:  I have reviewed the data as listed    Latest Ref Rng & Units 12/23/2022   10:32 AM  CBC  WBC 4.0 - 10.5 K/uL 5.7   Hemoglobin 12.0 - 15.0 g/dL 13.6   Hematocrit 36.0 - 46.0 % 41.7   Platelets 150 - 400 K/uL 231        No data to display           RADIOGRAPHIC STUDIES: I have personally reviewed the radiological images as listed and agreed with the findings in the report. MM RT BREAST BX W LOC DEV 1ST LESION IMAGE BX SPEC STEREO GUIDE  Addendum  Date: 12/02/2022   ADDENDUM REPORT: 12/02/2022 08:05 ADDENDUM: Pathology revealed BREAST ASYMMETRY, RIGHT UPPER POSTERIOR; STEREOTACTIC BIOPSY: GRADE 2 INVASIVE MAMMARY CARCINOMA WITH MIXED LOBULAR AND DUCTAL FEATURES. IN SITU CARCINOMA, DEFINITIVE CLASSIFICATION DEFERRED TO EXCISION (coil clip). This was found to be concordant by Dr. Claudie Revering. Pathology results were discussed with the patient by telephone. The patient reported doing well after the biopsy with tenderness at the site. Post biopsy instructions were reviewed and questions were answered. The patient was encouraged to call [facility] for any additional concerns. My direct number was given. A surgical and medical oncologist referral will be arranged by Casper Harrison RN, Oncology Nurse Navigator of [facility]. Pathology results reported by Stacie Acres RN on 12/01/2022. Electronically Signed   By: Claudie Revering M.D.   On: 12/02/2022 08:05   Result Date: 12/02/2022 CLINICAL DATA:  Subtle distortion upper right breast at mammography with no ultrasound correlate. EXAM: RIGHT BREAST STEREOTACTIC CORE NEEDLE BIOPSY COMPARISON:  Previous exam(s). FINDINGS: The patient and I discussed the procedure of stereotactic-guided biopsy including benefits and alternatives. We discussed the high likelihood of a successful procedure. We discussed the risks of the procedure including infection,  bleeding, tissue injury, clip migration, and inadequate sampling. Informed written consent was given. The usual time out protocol was performed immediately prior to the procedure. Using sterile technique and 1% Lidocaine as local anesthetic, under stereotactic guidance, a 9 gauge vacuum assisted device was used to perform core needle biopsy of the recently demonstrated small area of subtle distortion in the posterior upper right breast using a cephalad approach. At the conclusion of the procedure, a  coil shaped tissue marker clip was deployed into the biopsy cavity. Follow-up 2-view mammogram was performed and dictated separately. IMPRESSION: Stereotactic-guided biopsy of a small area of subtle distortion in the posterior upper right breast. No apparent complications. Electronically Signed: By: Claudie Revering M.D. On: 11/30/2022 08:43  MM CLIP PLACEMENT RIGHT  Result Date: 11/30/2022 CLINICAL DATA:  Status post 3D stereotactic guided core needle biopsy of a small area of subtle distortion in the posterior upper right breast. EXAM: 3D DIAGNOSTIC RIGHT MAMMOGRAM POST STEREOTACTIC BIOPSY COMPARISON:  Previous exam(s). FINDINGS: 3D Mammographic images were obtained following 3D stereotactic guided biopsy of a recently demonstrated small area of subtle distortion in the posterior upper right breast. The biopsy marking clip is in expected position at the site of biopsy. IMPRESSION: Appropriate positioning of the coil shaped biopsy marking clip at the site of biopsy in the posterior upper right breast. Final Assessment: Post Procedure Mammograms for Marker Placement Electronically Signed   By: Claudie Revering M.D.   On: 11/30/2022 09:14  US BREAST ASPIRATION LEFT  Result Date: 11/30/2022 CLINICAL DATA:  1.4 cm hypoechoic mass in the 3 o'clock position of the left breast at recent mammography and ultrasound. EXAM: ULTRASOUND GUIDED LEFT BREAST CYST ASPIRATION COMPARISON:  Previous exam(s). PROCEDURE: The patient and I discussed the procedure of ultrasound-guided aspiration including benefits and alternatives. We discussed the high likelihood of a successful procedure. We discussed the risks of the procedure including infection, bleeding, tissue injury, and inadequate sampling. Informed written consent was given. The usual time out protocol was performed immediately prior to the procedure. Using sterile technique, 1% lidocaine, under direct ultrasound visualization, needle aspiration of recently demonstrated 1.4 cm  hypoechoic mass in the 3 o'clock position of the left breast, 2 cm from the nipple, was performed. This yielded less than 1 cc of cloudy, green fluid. This resulted in complete resolution of the mass. IMPRESSION: Ultrasound-guided aspiration of 1.4 cm benign cyst in the 3 o'clock position of the left breast. No apparent complications. RECOMMENDATIONS: Follow-up based on the pathology results from the right breast stereotactic guided core needle biopsy performed today. Electronically Signed   By: Claudie Revering M.D.   On: 11/30/2022 09:11

## 2022-12-23 NOTE — Progress Notes (Signed)
Accompanied patient and daughter to initial medical oncology appointment.   Reviewed Breast Cancer treatment handbook.   Care plan summary given to patient.   Reviewed outreach programs and cancer center services.

## 2022-12-23 NOTE — Assessment & Plan Note (Signed)
Refer to genetic counselor for genetic testing.

## 2022-12-23 NOTE — Assessment & Plan Note (Signed)
Discussed with patient

## 2022-12-24 ENCOUNTER — Ambulatory Visit
Admission: RE | Admit: 2022-12-24 | Discharge: 2022-12-24 | Disposition: A | Discharge status: Home / Self Care | Payer: Medicare Other | Source: Ambulatory Visit | Attending: Surgery | Admitting: Surgery

## 2022-12-24 DIAGNOSIS — C50411 Malignant neoplasm of upper-outer quadrant of right female breast: Secondary | ICD-10-CM | POA: Insufficient documentation

## 2022-12-24 HISTORY — PX: BREAST BIOPSY: SHX20

## 2022-12-24 LAB — CANCER ANTIGEN 15-3: CA 15-3: 19.8 U/mL (ref 0.0–25.0)

## 2022-12-24 LAB — CANCER ANTIGEN 27.29: CA 27.29: 26.6 U/mL (ref 0.0–38.6)

## 2022-12-24 MED ORDER — LIDOCAINE HCL (PF) 1 % IJ SOLN
10.0000 mL | Freq: Once | INTRAMUSCULAR | Status: AC
  Administered 2022-12-24: 10 mL

## 2022-12-25 ENCOUNTER — Encounter
Admission: RE | Admit: 2022-12-25 | Discharge: 2022-12-25 | Disposition: A | Discharge status: Home / Self Care | Payer: Medicare Other | Source: Ambulatory Visit | Attending: Surgery | Admitting: Surgery

## 2022-12-25 VITALS — Ht 64.0 in | Wt 195.0 lb

## 2022-12-25 DIAGNOSIS — I129 Hypertensive chronic kidney disease with stage 1 through stage 4 chronic kidney disease, or unspecified chronic kidney disease: Secondary | ICD-10-CM | POA: Insufficient documentation

## 2022-12-25 DIAGNOSIS — Z01812 Encounter for preprocedural laboratory examination: Secondary | ICD-10-CM

## 2022-12-25 DIAGNOSIS — N1832 Chronic kidney disease, stage 3b: Secondary | ICD-10-CM | POA: Diagnosis not present

## 2022-12-25 DIAGNOSIS — R9431 Abnormal electrocardiogram [ECG] [EKG]: Secondary | ICD-10-CM | POA: Insufficient documentation

## 2022-12-25 DIAGNOSIS — Z01810 Encounter for preprocedural cardiovascular examination: Secondary | ICD-10-CM | POA: Insufficient documentation

## 2022-12-25 DIAGNOSIS — I10 Essential (primary) hypertension: Secondary | ICD-10-CM

## 2022-12-25 HISTORY — DX: Polyp of colon: K63.5

## 2022-12-25 HISTORY — DX: Chronic kidney disease, stage 3 unspecified: N18.30

## 2022-12-25 HISTORY — DX: Chronic kidney disease, stage 3b: N18.32

## 2022-12-25 HISTORY — DX: Type 2 diabetes mellitus with diabetic chronic kidney disease: E11.22

## 2022-12-25 HISTORY — DX: Diverticulosis of intestine, part unspecified, without perforation or abscess without bleeding: K57.90

## 2022-12-25 HISTORY — DX: Pure hypercholesterolemia, unspecified: E78.00

## 2022-12-25 NOTE — Patient Instructions (Addendum)
Your procedure is scheduled on: Friday, January 12 Report to the Registration Desk on the 1st floor of the [facility] at the time you were given to go to radiology first before surgery. You can call [phone] on Thursday, January 11 between 1pm-3pm to get your arrival time.  REMEMBER: Instructions that are not followed completely may result in serious medical risk, up to and including death; or upon the discretion of your surgeon and anesthesiologist your surgery may need to be rescheduled.  Do not eat food after midnight the night before surgery.  No gum chewing, lozengers or hard candies.  You may however, drink water up to 2 hours before you are scheduled to arrive for your surgery. Do not drink anything within 2 hours of your scheduled arrival time.  TAKE THESE MEDICATIONS THE MORNING OF SURGERY WITH A SIP OF WATER:  Metoprolol Pravastatin  Metformin - hold 2 days prior to surgery. Last day to take Metformin is Tuesday, January 9. Resume AFTER surgery.  Use the lidocaine-prilocaine (EMLA) cream as instructed.  One week prior to surgery: starting January 5 Stop Anti-inflammatories (NSAIDS) such as Advil, Aleve, Ibuprofen, Motrin, Naproxen, Naprosyn and Aspirin based products such as Excedrin, Goodys Powder, BC Powder. Stop ANY OVER THE COUNTER supplements until after surgery. Stop all vitamins except vitamin B12. You may however, continue to take Tylenol if needed for pain up until the day of surgery.  No Alcohol for 24 hours before or after surgery.  No Smoking including e-cigarettes for 24 hours prior to surgery.  No chewable tobacco products for at least 6 hours prior to surgery.  No nicotine patches on the day of surgery.  Do not use any "recreational" drugs for at least a week prior to your surgery.  Please be advised that the combination of cocaine and anesthesia may have negative outcomes, up to and including death. If you test positive for cocaine, your surgery will  be cancelled.  On the morning of surgery brush your teeth with toothpaste and water, you may rinse your mouth with mouthwash if you wish. Do not swallow any toothpaste or mouthwash.  Use CHG Soap as directed on instruction sheet.  Do not wear jewelry, make-up, hairpins, clips or nail polish.  Do not wear lotions, powders, or perfumes. NO deodorant.   Do not shave body from the neck down 48 hours prior to surgery just in case you cut yourself which could leave a site for infection.  Also, freshly shaved skin may become irritated if using the CHG soap.  Contact lenses, hearing aids and dentures may not be worn into surgery.  Do not bring valuables to the hospital. [facility] is not responsible for any missing/lost belongings or valuables.   Notify your doctor if there is any change in your medical condition (cold, fever, infection).  Wear comfortable clothing (specific to your surgery type) to the hospital.  After surgery, you can help prevent lung complications by doing breathing exercises.  Take deep breaths and cough every 1-2 hours. Your doctor may order a device called an Incentive Spirometer to help you take deep breaths.  If you are being discharged the day of surgery, you will not be allowed to drive home. You will need a responsible adult (18 years or older) to drive you home and stay with you that night.   If you are taking public transportation, you will need to have a responsible adult (18 years or older) with you. Please confirm with your physician that it  is acceptable to use public transportation.   Please call the [facility] at [phone] if you have any questions about these instructions.  Surgery Visitation Policy:  Patients undergoing a surgery or procedure may have two family members or support persons with them as long as the person is not COVID-19 positive or experiencing its symptoms.      Preparing for Surgery with CHLORHEXIDINE  GLUCONATE (CHG) Soap  Chlorhexidine Gluconate (CHG) Soap  o An antiseptic cleaner that kills germs and bonds with the skin to continue killing germs even after washing  o Used for showering the night before surgery and morning of surgery  Before surgery, you can play an important role by reducing the number of germs on your skin.  CHG (Chlorhexidine gluconate) soap is an antiseptic cleanser which kills germs and bonds with the skin to continue killing germs even after washing.  Please do not use if you have an allergy to CHG or antibacterial soaps. If your skin becomes reddened/irritated stop using the CHG.  1. Shower the NIGHT BEFORE SURGERY and the MORNING OF SURGERY with CHG soap.  2. If you choose to wash your hair, wash your hair first as usual with your normal shampoo.  3. After shampooing, rinse your hair and body thoroughly to remove the shampoo.  4. Use CHG as you would any other liquid soap. You can apply CHG directly to the skin and wash gently with a scrungie or a clean washcloth.  5. Apply the CHG soap to your body only from the neck down. Do not use on open wounds or open sores. Avoid contact with your eyes, ears, mouth, and genitals (private parts). Wash face and genitals (private parts) with your normal soap.  6. Wash thoroughly, paying special attention to the area where your surgery will be performed.  7. Thoroughly rinse your body with warm water.  8. Do not shower/wash with your normal soap after using and rinsing off the CHG soap.  9. Pat yourself dry with a clean towel.  10. Wear clean pajamas to bed the night before surgery.  12. Place clean sheets on your bed the night of your first shower and do not sleep with pets.  13. Shower again with the CHG soap on the day of surgery prior to arriving at the hospital.  14. Do not apply any deodorants/lotions/powders.  15. Please wear clean clothes to the hospital.

## 2022-12-29 ENCOUNTER — Ambulatory Visit
Admission: RE | Admit: 2022-12-29 | Discharge: 2022-12-29 | Disposition: A | Discharge status: Home / Self Care | Payer: Medicare Other | Source: Ambulatory Visit | Attending: Oncology | Admitting: Oncology

## 2022-12-29 DIAGNOSIS — C50919 Malignant neoplasm of unspecified site of unspecified female breast: Secondary | ICD-10-CM

## 2022-12-29 MED ORDER — GADOBUTROL 1 MMOL/ML IV SOLN
10.0000 mL | Freq: Once | INTRAVENOUS | Status: AC | PRN
  Administered 2022-12-29: 10 mL via INTRAVENOUS

## 2023-01-01 ENCOUNTER — Ambulatory Visit
Admission: RE | Admit: 2023-01-01 | Discharge: 2023-01-01 | Disposition: A | Discharge status: Home / Self Care | Payer: Medicare Other | Source: Ambulatory Visit | Attending: Surgery | Admitting: Surgery

## 2023-01-01 ENCOUNTER — Telehealth: Payer: Self-pay

## 2023-01-01 ENCOUNTER — Encounter: Admission: RE | Payer: Self-pay | Source: Home / Self Care

## 2023-01-01 ENCOUNTER — Ambulatory Visit: Admission: RE | Admit: 2023-01-01 | Payer: Medicare Other | Source: Ambulatory Visit

## 2023-01-01 ENCOUNTER — Ambulatory Visit: Admission: RE | Admit: 2023-01-01 | Payer: Medicare Other | Source: Home / Self Care | Admitting: Surgery

## 2023-01-01 ENCOUNTER — Encounter: Payer: Self-pay | Admitting: *Deleted

## 2023-01-01 DIAGNOSIS — C50919 Malignant neoplasm of unspecified site of unspecified female breast: Secondary | ICD-10-CM

## 2023-01-01 DIAGNOSIS — C50411 Malignant neoplasm of upper-outer quadrant of right female breast: Secondary | ICD-10-CM

## 2023-01-01 SURGERY — BREAST LUMPECTOMY,RADIO FREQ LOCALIZER,AXILLARY SENTINEL LYMPH NODE BIOPSY
Anesthesia: General | Laterality: Right

## 2023-01-01 NOTE — Progress Notes (Signed)
Orders placed for MR breast biopsies of left and right breast.   Dr. [name] has postponed lumpectomy pending the biopsies.

## 2023-01-01 NOTE — Telephone Encounter (Signed)
-----  Message from Earlie Server, MD sent at 12/31/2022 11:09 PM EST ----- Wilhemena Durie,  MRI showed additional breast mass, and I recommend MRI guided biopsy of right upper inner quadrant nodule, left breast lower outer quadrant nodules x 2

## 2023-01-01 NOTE — Telephone Encounter (Signed)
Biopsy orders have been placed by [facility].

## 2023-01-05 ENCOUNTER — Other Ambulatory Visit: Payer: Self-pay | Admitting: Oncology

## 2023-01-05 DIAGNOSIS — C50919 Malignant neoplasm of unspecified site of unspecified female breast: Secondary | ICD-10-CM

## 2023-01-06 ENCOUNTER — Encounter: Payer: Self-pay | Admitting: *Deleted

## 2023-01-06 NOTE — Progress Notes (Signed)
Gave Ms. Theiss her appt. Details for her MRI breast biopsy on 1/26.  Copy mailed as well.

## 2023-01-15 ENCOUNTER — Other Ambulatory Visit: Payer: Medicare Other

## 2023-01-15 ENCOUNTER — Inpatient Hospital Stay: Admission: RE | Admit: 2023-01-15 | Payer: Medicare Other | Source: Ambulatory Visit

## 2023-01-15 ENCOUNTER — Encounter: Payer: Self-pay | Admitting: *Deleted

## 2023-01-15 NOTE — Progress Notes (Signed)
Emma Torres called because her MRI biopsy has been cancelled due to machine down.  They are going to call her back to r/s.  She will call me with the new date.

## 2023-01-15 NOTE — Progress Notes (Signed)
Emma Torres's biopsy has been rescheduled for 2/5.   Message sent to Dr. [name] to make him aware of the change.

## 2023-01-21 ENCOUNTER — Inpatient Hospital Stay: Payer: Medicare Other | Attending: Oncology | Admitting: Licensed Clinical Social Worker

## 2023-01-21 ENCOUNTER — Inpatient Hospital Stay: Payer: Medicare Other

## 2023-01-21 ENCOUNTER — Encounter: Payer: Self-pay | Admitting: Licensed Clinical Social Worker

## 2023-01-21 DIAGNOSIS — C50919 Malignant neoplasm of unspecified site of unspecified female breast: Secondary | ICD-10-CM | POA: Diagnosis not present

## 2023-01-21 DIAGNOSIS — Z8049 Family history of malignant neoplasm of other genital organs: Secondary | ICD-10-CM

## 2023-01-21 DIAGNOSIS — Z803 Family history of malignant neoplasm of breast: Secondary | ICD-10-CM | POA: Diagnosis not present

## 2023-01-21 DIAGNOSIS — Z8042 Family history of malignant neoplasm of prostate: Secondary | ICD-10-CM | POA: Diagnosis not present

## 2023-01-21 NOTE — Progress Notes (Signed)
REFERRING PROVIDER: Earlie Server, MD [place],   [id number]  PRIMARY PROVIDER:  Baxter Hire, MD  PRIMARY REASON FOR VISIT:  1. Invasive carcinoma of breast ([place])   2. Family history of breast cancer   3. Family history of prostate cancer   4. Family history of uterine cancer      HISTORY OF PRESENT ILLNESS:   Emma Torres, a 77 y.o. female, was seen for a [facility] cancer genetics consultation at the request of Dr. Tasia Catchings due to a personal and family history of breast cancer.  Emma Torres presents to clinic today to discuss the possibility of a hereditary predisposition to cancer, genetic testing, and to further clarify her future cancer risks, as well as potential cancer risks for family members.   In 2024, at the age of 81, [name] was diagnosed with invasive carcinoma of the right breast, ER/PR+, HER2-. The treatment plan includes surgery, potential Oncotype Dx, adjuvant radiation and adjuvant antiestrogen therapy.  CANCER HISTORY:  Oncology History  Invasive carcinoma of breast ([place])  10/24/2021 Mammogram   Bilateral diagnostic mammogram 1.  Stable appearance of probably benign left breast mass. 2.  No findings of malignancy in either breast.   11/09/2022 Mammogram   Bilateral diagnostic mammogram and [place] bilateral breast  1. There is an area of possible subtle architectural distortion in the RIGHT upper breast at posterior depth. Recommend stereotactic guided biopsy for definitive characterization. 2. There is a 14 mm mass in the LEFT breast at 3 o'clock 2 cm rom the nipple. This likely reflects a complicated cyst. Given hypoechoic appearance on today's exam, recommend ultrasound-guided aspiration for definitive characterization. 3. Stable LEFT breast mass at 12 o'clock for greater than 2 years, consistent with a benign etiology.   12/23/2022 Initial Diagnosis   Invasive carcinoma of breast  Patient gets mammogram surveillance for left breast mass which has  been stable. Recent mammogram showed right breast distortion.   11/30/2022 Right breast stereotactic biopsy showed Invasive mammary carcinoma with mixed lobular and ductal features, Grade 2, no LVI, ER 90%+, PR 90%+, HER2 - [facility] 0]   Menarche at age of 33 First live birth at age of 32 OCP use: denies History of hysterectomy: yes Menopausal status: postmenopausal History of HRT use: 2 years History of chest radiation: denies Number of previous breast biopsies:  right breast.    12/23/2022 Cancer Staging   Staging form: Breast, AJCC 8th Edition - Clinical: Stage Unknown (cTX, cN0, cM0, G2, ER+, PR+, HER2-) - Signed by Earlie Server, MD on 12/23/2022 Histologic grading system: 3 grade system      RISK FACTORS:  Menarche was at age 51.  First live birth at age 41.  OCP use for approximately 0 years.  Ovaries intact: yes.  Hysterectomy: yes.  Menopausal status: postmenopausal.  HRT use: 2 years. Colonoscopy: yes;  few polyps on first cscope .  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)    Cough    chronic   Diverticulosis    Hypercholesterolemia    Hypertension    IBS (irritable bowel syndrome)    Stage 3b chronic kidney disease (CKD) ([place])     Past Surgical History:  Procedure Laterality Date   BREAST BIOPSY Right 11/30/2022   MM RT BREAST  BX W LOC DEV 1ST LESION IMAGE BX SPEC STEREO GUIDE 11/30/2022 [facility]   BREAST BIOPSY Right 12/24/2022   MM RT RADIO FREQUENCY TAG LOC MAMMO GUIDE 12/24/2022 [facility]   breast biospy Right 11/30/2022   rt stereo asymetry, coil clip, path pending   BREAST CYST ASPIRATION Left 11/30/2022   BREAST EXCISIONAL BIOPSY Right 2011   negative   CATARACT EXTRACTION W/PHACO Left 11/18/2017   Procedure: CATARACT EXTRACTION PHACO AND INTRAOCULAR LENS PLACEMENT  ([place]);  Surgeon: Eulogio Bear, MD;  Location: [facility] ORS;  Service: Ophthalmology;  Laterality: Left;  Lot # [id number] H [place]: 00:28.6 AP%: 10.8 CDE: 3.10    CATARACT EXTRACTION W/PHACO Right 01/13/2018   Procedure: CATARACT EXTRACTION PHACO AND INTRAOCULAR LENS PLACEMENT (I[facility]);  Surgeon: Eulogio Bear, MD;  Location: [facility] ORS;  Service: Ophthalmology;  Laterality: Right;  cassette lot #  [id number]  [place]   00:29.4 AP%   9.6 CDE2.82   COLONOSCOPY     2005, 2009, 2014   COLONOSCOPY WITH PROPOFOL N/A 08/29/2018   Procedure: COLONOSCOPY WITH PROPOFOL;  Surgeon: Manya Silvas, MD;  [facility] END[facility];  Service: Endoscopy;  Laterality: N/A;   TONSILLECTOMY     TUBAL LIGATION     VAGINAL HYSTERECTOMY      FAMILY HISTORY:  We obtained a detailed, 4-generation family history.  Significant diagnoses are listed below: Family History  Problem Relation Age of Onset   Prostate cancer Brother 72   Multiple myeloma Brother 43   Breast cancer Maternal Aunt        dx 54s   Breast cancer Maternal Aunt        dx 66s   Uterine cancer Maternal Grandmother        dx 38s   Breast cancer Cousin        dx 68s   Emma Torres has 2 daughters, 33 and 19. She had 3 brothers and 2 sisters. One brother had prostate cancer and another brother had multiple myeloma.   Emma Torres mother is living at 23, she had a cancerous lump removed from her leg in her 30s-70s. Patient has 8 maternal uncles and 2 aunts. Both aunts had breast cancer in their 4s. Maternal grandmother had uterine cancer in her 61s.   Emma Torres father had prostate cancer in his 55s and died at 70. Patient had 2 paternal uncles. One paternal cousin had breast cancer in her 53s.   Emma Torres is unaware of previous family history of genetic testing for hereditary cancer risks. There is no reported Ashkenazi Jewish ancestry. There is no known consanguinity.    GENETIC COUNSELING ASSESSMENT: Ms. Cham is a 77 y.o. female with a  personal and family history of cancer which is somewhat suggestive of a hereditary cancer syndrome and predisposition to cancer. We, therefore, discussed and recommended the following at today's visit.   DISCUSSION: We discussed that approximately 10% of breast cancer is hereditary. Most cases of hereditary breast cancer are associated with BRCA1/BRCA2 genes, although there are other genes associated with hereditary cancer as well. Cancers and risks are gene specific. We discussed that testing is beneficial for several reasons including knowing about cancer risks, identifying potential screening and risk-reduction options that may be appropriate, and to understand if other family members could be at risk for cancer and allow them to undergo genetic testing.   We reviewed the characteristics, features and inheritance patterns of hereditary cancer syndromes. We also discussed genetic testing, including the appropriate family members to test,  the process of testing, insurance coverage and turn-around-time for results. We discussed the implications of a negative, positive and/or variant of uncertain significant result. We recommended Ms. Horace pursue genetic testing for the [facility] Multi-Cancer+RNA gene panel.   Based on her personal and family history of cancer, she meets medical criteria for genetic testing. Despite that she meets criteria, she may still have an out of pocket cost. We discussed that if her out of pocket cost for testing is over $100, the laboratory will call and confirm whether she wants to proceed with testing.  If the out of pocket cost of testing is less than $100 she will be billed by the genetic testing laboratory.   PLAN: After considering the risks, benefits, and limitations, Ms. Evangelist provided informed consent to pursue genetic testing and the blood sample was sent to [facility] for analysis of the Multi-Cancer+RNA panel. Results should be available within approximately 2-3  weeks' time, at which point they will be disclosed by telephone to Ms. Whitlatch, as will any additional recommendations warranted by these results. Ms. Sorto will receive a summary of her genetic counseling visit and a copy of her results once available. This information will also be available in Epic.   Ms. Deegan questions were answered to her satisfaction today. Our contact information was provided should additional questions or concerns arise. Thank you for the referral and allowing [place] to share in the care of your patient.   Faith Rogue, MS, [facility] Genetic Counselor [email or web address] Phone: [phone]  The patient was seen for a total of 25 minutes in face-to-face genetic counseling.  Dr. Grayland Ormond was available for discussion regarding this case.   _______________________________________________________________________ For Office Staff:  Number of people involved in session: 1 Was an Intern/ student involved with case: no

## 2023-01-25 ENCOUNTER — Ambulatory Visit
Admission: RE | Admit: 2023-01-25 | Discharge: 2023-01-25 | Disposition: A | Discharge status: Home / Self Care | Payer: Medicare Other | Source: Ambulatory Visit | Attending: Oncology | Admitting: Oncology

## 2023-01-25 ENCOUNTER — Other Ambulatory Visit: Payer: Medicare Other

## 2023-01-25 DIAGNOSIS — C50919 Malignant neoplasm of unspecified site of unspecified female breast: Secondary | ICD-10-CM

## 2023-01-25 MED ORDER — GADOPICLENOL 0.5 MMOL/ML IV SOLN
9.0000 mL | Freq: Once | INTRAVENOUS | Status: AC | PRN
  Administered 2023-01-25: 9 mL via INTRAVENOUS

## 2023-01-27 NOTE — Addendum Note (Signed)
Encounter addended by: Dorise Bullion, RT on: 01/27/2023 3:44 PM  Actions taken: Imaging Exam ended

## 2023-01-29 ENCOUNTER — Other Ambulatory Visit: Payer: Self-pay | Admitting: Surgery

## 2023-01-29 DIAGNOSIS — Z170 Estrogen receptor positive status [ER+]: Secondary | ICD-10-CM

## 2023-02-02 ENCOUNTER — Telehealth: Payer: Self-pay | Admitting: Licensed Clinical Social Worker

## 2023-02-02 ENCOUNTER — Ambulatory Visit
Admission: RE | Admit: 2023-02-02 | Discharge: 2023-02-02 | Disposition: A | Discharge status: Home / Self Care | Payer: Medicare Other | Source: Ambulatory Visit | Attending: Surgery | Admitting: Surgery

## 2023-02-02 DIAGNOSIS — C50411 Malignant neoplasm of upper-outer quadrant of right female breast: Secondary | ICD-10-CM | POA: Insufficient documentation

## 2023-02-02 DIAGNOSIS — Z170 Estrogen receptor positive status [ER+]: Secondary | ICD-10-CM | POA: Insufficient documentation

## 2023-02-02 HISTORY — PX: BREAST BIOPSY: SHX20

## 2023-02-02 MED ORDER — LIDOCAINE HCL (PF) 1 % IJ SOLN
10.0000 mL | Freq: Once | INTRAMUSCULAR | Status: AC
  Administered 2023-02-02: 10 mL

## 2023-02-03 ENCOUNTER — Encounter
Admission: RE | Admit: 2023-02-03 | Discharge: 2023-02-03 | Disposition: A | Discharge status: Home / Self Care | Payer: Medicare Other | Source: Ambulatory Visit | Attending: Surgery | Admitting: Surgery

## 2023-02-03 DIAGNOSIS — Z01812 Encounter for preprocedural laboratory examination: Secondary | ICD-10-CM

## 2023-02-03 NOTE — Pre-Procedure Instructions (Signed)
PAT follow up completed with patient, medications, allergies, and HX reviewed, all questions answered pertaining to her surgery.

## 2023-02-03 NOTE — Telephone Encounter (Signed)
I contacted Ms. Giannattasio to discuss her genetic testing results. No pathogenic variants were identified in the 70 genes analyzed. Detailed clinic note to follow.   The test report has been scanned into EPIC and is located under the Molecular Pathology section of the Results Review tab.  A portion of the result report is included below for reference.      Faith Rogue, MS, [facility] Genetic Counselor [email or web address] Phone: [phone]

## 2023-02-04 ENCOUNTER — Encounter: Payer: Self-pay | Admitting: Licensed Clinical Social Worker

## 2023-02-04 ENCOUNTER — Ambulatory Visit: Payer: Self-pay | Admitting: Licensed Clinical Social Worker

## 2023-02-04 DIAGNOSIS — Z1379 Encounter for other screening for genetic and chromosomal anomalies: Secondary | ICD-10-CM | POA: Insufficient documentation

## 2023-02-04 MED ORDER — SODIUM CHLORIDE 0.9 % IV SOLN: INTRAVENOUS | Status: DC

## 2023-02-04 MED ORDER — CEFAZOLIN SODIUM-DEXTROSE 2-4 GM/100ML-% IV SOLN
2.0000 g | INTRAVENOUS | Status: AC
  Administered 2023-02-05: 2 g via INTRAVENOUS

## 2023-02-04 MED ORDER — CHLORHEXIDINE GLUCONATE CLOTH 2 % EX PADS: 6.0000 | MEDICATED_PAD | Freq: Once | CUTANEOUS | Status: DC

## 2023-02-04 MED ORDER — ORAL CARE MOUTH RINSE: 15.0000 mL | Freq: Once | OROMUCOSAL | Status: AC

## 2023-02-04 MED ORDER — CHLORHEXIDINE GLUCONATE 0.12 % MT SOLN: 15.0000 mL | Freq: Once | OROMUCOSAL | Status: AC

## 2023-02-04 MED ORDER — FAMOTIDINE 20 MG PO TABS: 20.0000 mg | ORAL_TABLET | Freq: Once | ORAL | Status: AC

## 2023-02-04 NOTE — Progress Notes (Signed)
HPI:   [name] was previously seen in the [place] clinic due to a personal and family history of cancer and concerns regarding a hereditary predisposition to cancer. Please refer to our prior cancer genetics clinic note for more information regarding our discussion, assessment and recommendations, at the time. [name] recent genetic test results were disclosed to her, as were recommendations warranted by these results. These results and recommendations are discussed in more detail below.  CANCER HISTORY:  Oncology History  Invasive carcinoma of breast ([place])  10/24/2021 Mammogram   Bilateral diagnostic mammogram 1.  Stable appearance of probably benign left breast mass. 2.  No findings of malignancy in either breast.   11/09/2022 Mammogram   Bilateral diagnostic mammogram and [place] bilateral breast  1. There is an area of possible subtle architectural distortion in the RIGHT upper breast at posterior depth. Recommend stereotactic guided biopsy for definitive characterization. 2. There is a 14 mm mass in the LEFT breast at 3 o'clock 2 cm rom the nipple. This likely reflects a complicated cyst. Given hypoechoic appearance on today's exam, recommend ultrasound-guided aspiration for definitive characterization. 3. Stable LEFT breast mass at 12 o'clock for greater than 2 years, consistent with a benign etiology.   12/23/2022 Initial Diagnosis   Invasive carcinoma of breast  Patient gets mammogram surveillance for left breast mass which has been stable. Recent mammogram showed right breast distortion.   11/30/2022 Right breast stereotactic biopsy showed Invasive mammary carcinoma with mixed lobular and ductal features, Grade 2, no LVI, ER 90%+, PR 90%+, HER2 - [facility] 0]   Menarche at age of 50 First live birth at age of 74 OCP use: denies History of hysterectomy: yes Menopausal status: postmenopausal History of HRT use: 2 years History of chest radiation:  denies Number of previous breast biopsies:  right breast.    12/23/2022 Cancer Staging   Staging form: Breast, AJCC 8th Edition - Clinical: Stage Unknown (cTX, cN0, cM0, G2, ER+, PR+, HER2-) - Signed by Earlie Server, MD on 12/23/2022 Histologic grading system: 3 grade system    Genetic Testing   No pathogenic variants identified on the [facility] Multi-Cancer+RNA panel. 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 The report date is 01/30/2023.  The Multi-Cancer + RNA Panel offered by [facility] includes sequencing and/or deletion/duplication analysis of the following 70 genes:  AIP*, ALK, APC*, ATM*, AXIN2*, BAP1*, BARD1*, BLM*, BMPR1A*, BRCA1*, BRCA2*, BRIP1*, CDC73*, CDH1*, CDK4, CDKN1B*, CDKN2A, CHEK2*, CTNNA1*, DICER1*, EPCAM, EGFR, FH*, FLCN*, GREM1, HOXB13, KIT, LZTR1, MAX*, MBD4, MEN1*, MET, MITF, MLH1*, MSH2*, MSH3*, MSH6*, MUTYH*, NF1*, NF2*, NTHL1*, PALB2*, PDGFRA, PMS2*, POLD1*, POLE*, POT1*, PRKAR1A*, PTCH1*, PTEN*, RAD51C*, RAD51D*, RB1*, RET, SDHA*, SDHAF2*, SDHB*, SDHC*, SDHD*, SMAD4*, SMARCA4*, SMARCB1*, SMARCE1*, STK11*, SUFU*, TMEM127*, TP53*, TSC1*, TSC2*, VHL*. RNA analysis is performed for * genes.     FAMILY HISTORY:  We obtained a detailed, 4-generation family history.  Significant diagnoses are listed below: Family History  Problem Relation Age of Onset   Prostate cancer Brother 58   Multiple myeloma Brother 68   Breast cancer Maternal Aunt        dx 12s   Breast cancer Maternal Aunt        dx 72s   Uterine cancer Maternal Grandmother        dx 73s   Breast cancer Cousin        dx 78s   [name] has 2 daughters, 22 and 71. She had 3 brothers and 2 sisters. One brother had prostate  cancer and another brother had multiple myeloma.    [name] mother is living at 75, she had a cancerous lump removed from her leg in her 79s-70s. Patient has 8 maternal uncles and 2 aunts. Both aunts had breast cancer in their 9s. Maternal grandmother had uterine cancer in her 66s.     [name] father had prostate cancer in his 104s and died at 76. Patient had 2 paternal uncles. One paternal cousin had breast cancer in her 66s.    [name] is unaware of previous family history of genetic testing for hereditary cancer risks. There is no reported Ashkenazi Jewish ancestry. There is no known consanguinity.      GENETIC TEST RESULTS:  The [facility] Multi-Cancer+RNA Panel found no pathogenic mutations.   The Multi-Cancer + RNA Panel offered by [facility] includes sequencing and/or deletion/duplication analysis of the following 70 genes:  AIP*, ALK, APC*, ATM*, AXIN2*, BAP1*, BARD1*, BLM*, BMPR1A*, BRCA1*, BRCA2*, BRIP1*, CDC73*, CDH1*, CDK4, CDKN1B*, CDKN2A, CHEK2*, CTNNA1*, DICER1*, EPCAM, EGFR, FH*, FLCN*, GREM1, HOXB13, KIT, LZTR1, MAX*, MBD4, MEN1*, MET, MITF, MLH1*, MSH2*, MSH3*, MSH6*, MUTYH*, NF1*, NF2*, NTHL1*, PALB2*, PDGFRA, PMS2*, POLD1*, POLE*, POT1*, PRKAR1A*, PTCH1*, PTEN*, RAD51C*, RAD51D*, RB1*, RET, SDHA*, SDHAF2*, SDHB*, SDHC*, SDHD*, SMAD4*, SMARCA4*, SMARCB1*, SMARCE1*, STK11*, SUFU*, TMEM127*, TP53*, TSC1*, TSC2*, VHL*. RNA analysis is performed for * genes.   The test report has been scanned into EPIC and is located under the Molecular Pathology section of the Results Review tab.  A portion of the result report is included below for reference. Genetic testing reported out on 01/30/2023.      Genetic testing identified a variant of uncertain significance (VUS) in the KIT gene called c.550C>A.  At this time, it is unknown if this variant is associated with an increased risk for cancer or if it is benign, but most uncertain variants are reclassified to benign. It should not be used to make medical management decisions. With time, we suspect the laboratory will determine the significance of this variant, if any. If the laboratory reclassifies this variant, we will attempt to contact [name] to discuss it further.   Even though a pathogenic variant was not  identified, possible explanations for the cancer in the family may include: There may be no hereditary risk for cancer in the family. The cancers in [name] and/or her family may be sporadic/familial or due to other genetic and environmental factors. There may be a gene mutation in one of these genes that current testing methods cannot detect but that chance is small. There could be another gene that has not yet been discovered, or that we have not yet tested, that is responsible for the cancer diagnoses in the family.  It is also possible there is a hereditary cause for the cancer in the family that [name] did not inherit.  Therefore, it is important to remain in touch with cancer genetics in the future so that we can continue to offer [name] the most up to date genetic testing.   ADDITIONAL GENETIC TESTING:  We discussed with [name] that her genetic testing was fairly extensive.  If there are additional relevant genes identified to increase cancer risk that can be analyzed in the future, we would be happy to discuss and coordinate this testing at that time.    CANCER SCREENING RECOMMENDATIONS:  [name] test result is considered negative (normal).  This means that we have not identified a hereditary cause for her personal and family history of  cancer at this time.   An individual's cancer risk and medical management are not determined by genetic test results alone. Overall cancer risk assessment incorporates additional factors, including personal medical history, family history, and any available genetic information that may result in a personalized plan for cancer prevention and surveillance. Therefore, it is recommended she continue to follow the cancer management and screening guidelines provided by her oncology and primary healthcare provider.  RECOMMENDATIONS FOR FAMILY MEMBERS:   Since she did not inherit a identifiable mutation in a cancer predisposition gene included on  this panel, her children could not have inherited a known mutation from her in one of these genes. Individuals in this family might be at some increased risk of developing cancer, over the general population risk, due to the family history of cancer.  Individuals in the family should notify their providers of the family history of cancer. We recommend women in this family have a yearly mammogram beginning at age 16, or 11 years younger than the earliest onset of cancer, an annual clinical breast exam, and perform monthly breast self-exams.  Family members should have colonoscopies by at age 8, or earlier, as recommended by their providers. Other members of the family may still carry a pathogenic variant in one of these genes that [name] did not inherit. Based on the family history, we recommend her maternal relatives, especially those diagnosed with cancer such as her brother,  have genetic counseling and testing. [name] will let us know if we can be of any assistance in coordinating genetic counseling and/or testing for this family member.   We do not recommend familial testing for the KIT variant of uncertain significance (VUS).  FOLLOW-UP:  Lastly, we discussed with [name] that cancer genetics is a rapidly advancing field and it is possible that new genetic tests will be appropriate for her and/or her family members in the future. We encouraged her to remain in contact with cancer genetics on an annual basis so we can update her personal and family histories and let her know of advances in cancer genetics that may benefit this family.   Our contact number was provided. [name] questions were answered to her satisfaction, and she knows she is welcome to call us at anytime with additional questions or concerns.    Faith Rogue, MS, [facility] Genetic Counselor [email or web address] Phone: [phone]

## 2023-02-05 ENCOUNTER — Ambulatory Visit: Payer: Medicare Other | Admitting: Urgent Care

## 2023-02-05 ENCOUNTER — Ambulatory Visit: Payer: Medicare Other | Admitting: Certified Registered"

## 2023-02-05 ENCOUNTER — Encounter
Admission: RE | Disposition: A | Discharge status: Home / Self Care | Payer: Self-pay | Source: Home / Self Care | Attending: Surgery

## 2023-02-05 ENCOUNTER — Ambulatory Visit
Admission: RE | Admit: 2023-02-05 | Discharge: 2023-02-05 | Disposition: A | Discharge status: Home / Self Care | Payer: Medicare Other | Source: Ambulatory Visit | Attending: Surgery | Admitting: Surgery

## 2023-02-05 ENCOUNTER — Encounter: Payer: Self-pay | Admitting: Surgery

## 2023-02-05 ENCOUNTER — Ambulatory Visit
Admission: RE | Admit: 2023-02-05 | Discharge: 2023-02-05 | Disposition: A | Discharge status: Home / Self Care | Payer: Medicare Other | Attending: Surgery | Admitting: Surgery

## 2023-02-05 ENCOUNTER — Other Ambulatory Visit: Payer: Self-pay

## 2023-02-05 DIAGNOSIS — Z7984 Long term (current) use of oral hypoglycemic drugs: Secondary | ICD-10-CM | POA: Diagnosis not present

## 2023-02-05 DIAGNOSIS — C50919 Malignant neoplasm of unspecified site of unspecified female breast: Secondary | ICD-10-CM

## 2023-02-05 DIAGNOSIS — Z170 Estrogen receptor positive status [ER+]: Secondary | ICD-10-CM

## 2023-02-05 DIAGNOSIS — Z833 Family history of diabetes mellitus: Secondary | ICD-10-CM | POA: Diagnosis not present

## 2023-02-05 DIAGNOSIS — Z8042 Family history of malignant neoplasm of prostate: Secondary | ICD-10-CM | POA: Diagnosis not present

## 2023-02-05 DIAGNOSIS — I129 Hypertensive chronic kidney disease with stage 1 through stage 4 chronic kidney disease, or unspecified chronic kidney disease: Secondary | ICD-10-CM | POA: Insufficient documentation

## 2023-02-05 DIAGNOSIS — N189 Chronic kidney disease, unspecified: Secondary | ICD-10-CM | POA: Diagnosis not present

## 2023-02-05 DIAGNOSIS — Z90710 Acquired absence of both cervix and uterus: Secondary | ICD-10-CM | POA: Insufficient documentation

## 2023-02-05 DIAGNOSIS — C50411 Malignant neoplasm of upper-outer quadrant of right female breast: Secondary | ICD-10-CM | POA: Insufficient documentation

## 2023-02-05 DIAGNOSIS — Z01812 Encounter for preprocedural laboratory examination: Secondary | ICD-10-CM

## 2023-02-05 DIAGNOSIS — Z8249 Family history of ischemic heart disease and other diseases of the circulatory system: Secondary | ICD-10-CM | POA: Diagnosis not present

## 2023-02-05 DIAGNOSIS — E1122 Type 2 diabetes mellitus with diabetic chronic kidney disease: Secondary | ICD-10-CM | POA: Diagnosis not present

## 2023-02-05 DIAGNOSIS — Z803 Family history of malignant neoplasm of breast: Secondary | ICD-10-CM | POA: Diagnosis not present

## 2023-02-05 DIAGNOSIS — E7800 Pure hypercholesterolemia, unspecified: Secondary | ICD-10-CM | POA: Insufficient documentation

## 2023-02-05 HISTORY — PX: BREAST LUMPECTOMY,RADIO FREQ LOCALIZER,AXILLARY SENTINEL LYMPH NODE BIOPSY: SHX6900

## 2023-02-05 LAB — GLUCOSE, CAPILLARY
Glucose-Capillary: 135 mg/dL — ABNORMAL HIGH (ref 70–99)
Glucose-Capillary: 172 mg/dL — ABNORMAL HIGH (ref 70–99)

## 2023-02-05 SURGERY — BREAST LUMPECTOMY,RADIO FREQ LOCALIZER,AXILLARY SENTINEL LYMPH NODE BIOPSY
Anesthesia: General | Site: Breast | Laterality: Right

## 2023-02-05 MED ORDER — FENTANYL CITRATE (PF) 100 MCG/2ML IJ SOLN: 25.0000 ug | INTRAMUSCULAR | Status: DC | PRN

## 2023-02-05 MED ORDER — PROPOFOL 10 MG/ML IV BOLUS
INTRAVENOUS | Status: DC | PRN
  Administered 2023-02-05: 200 mg via INTRAVENOUS

## 2023-02-05 MED ORDER — DOCUSATE SODIUM 100 MG PO CAPS: 100.0000 mg | ORAL_CAPSULE | Freq: Two times a day (BID) | ORAL | 0 refills | Status: AC | PRN

## 2023-02-05 MED ORDER — FENTANYL CITRATE (PF) 100 MCG/2ML IJ SOLN
INTRAMUSCULAR | Status: DC | PRN
  Administered 2023-02-05 (×2): 25 ug via INTRAVENOUS

## 2023-02-05 MED ORDER — EPHEDRINE SULFATE (PRESSORS) 50 MG/ML IJ SOLN
INTRAMUSCULAR | Status: DC | PRN
  Administered 2023-02-05: 5 mg via INTRAVENOUS
  Administered 2023-02-05: 10 mg via INTRAVENOUS
  Administered 2023-02-05: 5 mg via INTRAVENOUS

## 2023-02-05 MED ORDER — CEFAZOLIN SODIUM-DEXTROSE 2-4 GM/100ML-% IV SOLN
INTRAVENOUS | Status: AC
  Filled 2023-02-05: qty 100

## 2023-02-05 MED ORDER — FAMOTIDINE 20 MG PO TABS
ORAL_TABLET | ORAL | Status: AC
  Administered 2023-02-05: 20 mg via ORAL
  Filled 2023-02-05: qty 1

## 2023-02-05 MED ORDER — DEXAMETHASONE SODIUM PHOSPHATE 10 MG/ML IJ SOLN
INTRAMUSCULAR | Status: DC | PRN
  Administered 2023-02-05: 5 mg via INTRAVENOUS

## 2023-02-05 MED ORDER — LIDOCAINE HCL (PF) 1 % IJ SOLN
INTRAMUSCULAR | Status: DC | PRN
  Administered 2023-02-05: 20 mL

## 2023-02-05 MED ORDER — OXYCODONE-ACETAMINOPHEN 5-325 MG PO TABS: 1.0000 | ORAL_TABLET | Freq: Three times a day (TID) | ORAL | 0 refills | Status: AC | PRN

## 2023-02-05 MED ORDER — OXYCODONE HCL 5 MG PO TABS
5.0000 mg | ORAL_TABLET | Freq: Once | ORAL | Status: AC
  Administered 2023-02-05: 5 mg via ORAL

## 2023-02-05 MED ORDER — PHENYLEPHRINE 80 MCG/ML (10ML) SYRINGE FOR IV PUSH (FOR BLOOD PRESSURE SUPPORT)
PREFILLED_SYRINGE | INTRAVENOUS | Status: DC | PRN
  Administered 2023-02-05 (×2): 160 ug via INTRAVENOUS

## 2023-02-05 MED ORDER — LIDOCAINE HCL (PF) 1 % IJ SOLN
INTRAMUSCULAR | Status: AC
  Filled 2023-02-05: qty 30

## 2023-02-05 MED ORDER — TECHNETIUM TC 99M TILMANOCEPT KIT
1.0000 | PACK | Freq: Once | INTRAVENOUS | Status: AC | PRN
  Administered 2023-02-05: 1.15 via INTRADERMAL

## 2023-02-05 MED ORDER — ACETAMINOPHEN 10 MG/ML IV SOLN
INTRAVENOUS | Status: AC
  Filled 2023-02-05: qty 100

## 2023-02-05 MED ORDER — FENTANYL CITRATE (PF) 100 MCG/2ML IJ SOLN
INTRAMUSCULAR | Status: AC
  Filled 2023-02-05: qty 2

## 2023-02-05 MED ORDER — CHLORHEXIDINE GLUCONATE 0.12 % MT SOLN
OROMUCOSAL | Status: AC
  Administered 2023-02-05: 15 mL via OROMUCOSAL
  Filled 2023-02-05: qty 15

## 2023-02-05 MED ORDER — BUPIVACAINE HCL (PF) 0.5 % IJ SOLN
INTRAMUSCULAR | Status: AC
  Filled 2023-02-05: qty 30

## 2023-02-05 MED ORDER — EPINEPHRINE PF 1 MG/ML IJ SOLN
INTRAMUSCULAR | Status: AC
  Filled 2023-02-05: qty 1

## 2023-02-05 MED ORDER — GLYCOPYRROLATE 0.2 MG/ML IJ SOLN
INTRAMUSCULAR | Status: DC | PRN
  Administered 2023-02-05: .2 mg via INTRAVENOUS

## 2023-02-05 MED ORDER — OXYCODONE HCL 5 MG PO TABS
ORAL_TABLET | ORAL | Status: AC
  Filled 2023-02-05: qty 1

## 2023-02-05 MED ORDER — STERILE WATER FOR IRRIGATION IR SOLN
Status: DC | PRN
  Administered 2023-02-05: 500 mL

## 2023-02-05 MED ORDER — ACETAMINOPHEN 10 MG/ML IV SOLN
INTRAVENOUS | Status: DC | PRN
  Administered 2023-02-05: 1000 mg via INTRAVENOUS

## 2023-02-05 MED ORDER — PHENYLEPHRINE HCL-NACL 20-0.9 MG/250ML-% IV SOLN
INTRAVENOUS | Status: DC | PRN
  Administered 2023-02-05: 25 ug/min via INTRAVENOUS

## 2023-02-05 MED ORDER — LIDOCAINE HCL (CARDIAC) PF 100 MG/5ML IV SOSY
PREFILLED_SYRINGE | INTRAVENOUS | Status: DC | PRN
  Administered 2023-02-05: 100 mg via INTRAVENOUS

## 2023-02-05 MED ORDER — ONDANSETRON HCL 4 MG/2ML IJ SOLN
INTRAMUSCULAR | Status: DC | PRN
  Administered 2023-02-05 (×2): 4 mg via INTRAVENOUS

## 2023-02-05 MED ORDER — PHENYLEPHRINE HCL-NACL 20-0.9 MG/250ML-% IV SOLN
INTRAVENOUS | Status: AC
  Filled 2023-02-05: qty 250

## 2023-02-05 SURGICAL SUPPLY — 46 items
ADH SKN CLS APL DERMABOND .7 (GAUZE/BANDAGES/DRESSINGS) ×1
APL PRP STRL LF DISP 70% ISPRP (MISCELLANEOUS) ×1
APPLIER CLIP 11 MED OPEN (CLIP)
APR CLP MED 11 20 MLT OPN (CLIP)
BLADE PHOTON ILLUMINATED (MISCELLANEOUS) ×1 IMPLANT
BLADE SURG 15 STRL LF DISP TIS (BLADE) ×1 IMPLANT
BLADE SURG 15 STRL SS (BLADE) ×1
CHLORAPREP W/TINT 26 (MISCELLANEOUS) ×1 IMPLANT
CLIP APPLIE 11 MED OPEN (CLIP) IMPLANT
CNTNR URN SCR LID CUP LEK RST (MISCELLANEOUS) IMPLANT
CONT SPEC 4OZ STRL OR WHT (MISCELLANEOUS) ×1
DERMABOND ADVANCED .7 DNX12 (GAUZE/BANDAGES/DRESSINGS) ×1 IMPLANT
DEVICE DUBIN SPECIMEN MAMMOGRA (MISCELLANEOUS) ×1 IMPLANT
DRAPE LAPAROTOMY 77X122 PED (DRAPES) ×1 IMPLANT
ELECT CAUTERY BLADE TIP 2.5 (TIP) ×1
ELECT REM PT RETURN 9FT ADLT (ELECTROSURGICAL) ×1
ELECTRODE CAUTERY BLDE TIP 2.5 (TIP) ×1 IMPLANT
ELECTRODE REM PT RTRN 9FT ADLT (ELECTROSURGICAL) ×1 IMPLANT
GAUZE 4X4 16PLY ~~LOC~~+RFID DBL (SPONGE) ×1 IMPLANT
GLOVE BIOGEL PI IND STRL 7.0 (GLOVE) ×1 IMPLANT
GLOVE SURG SYN 6.5 ES PF (GLOVE) ×2 IMPLANT
GLOVE SURG SYN 6.5 PF PI (GLOVE) ×2 IMPLANT
GOWN STRL REUS W/ TWL LRG LVL3 (GOWN DISPOSABLE) ×3 IMPLANT
GOWN STRL REUS W/TWL LRG LVL3 (GOWN DISPOSABLE) ×3
KIT MARKER MARGIN INK (KITS) ×1 IMPLANT
KIT TURNOVER KIT A (KITS) ×1 IMPLANT
LABEL OR SOLS (LABEL) ×1 IMPLANT
LIGHT WAVEGUIDE WIDE FLAT (MISCELLANEOUS) IMPLANT
MANIFOLD NEPTUNE II (INSTRUMENTS) ×1 IMPLANT
MARKER MARGIN CORRECT CLIP (MARKER) ×1 IMPLANT
NEEDLE HYPO 22GX1.5 SAFETY (NEEDLE) ×2 IMPLANT
PACK BASIN MINOR ARMC (MISCELLANEOUS) ×1 IMPLANT
SET LOCALIZER 20 PROBE US (MISCELLANEOUS) ×1 IMPLANT
SUT MNCRL 4-0 (SUTURE) ×2
SUT MNCRL 4-0 27XMFL (SUTURE) ×2
SUT SILK 3 0 12 30 (SUTURE) IMPLANT
SUT SILK 3-0 (SUTURE) IMPLANT
SUT VIC AB 3-0 SH 27 (SUTURE) ×3
SUT VIC AB 3-0 SH 27X BRD (SUTURE) ×2 IMPLANT
SUTURE MNCRL 4-0 27XMF (SUTURE) ×2 IMPLANT
SYR 20ML LL LF (SYRINGE) ×1 IMPLANT
SYR BULB IRRIG 60ML STRL (SYRINGE) ×1 IMPLANT
TRAP FLUID SMOKE EVACUATOR (MISCELLANEOUS) ×1 IMPLANT
TRAP NEPTUNE SPECIMEN COLLECT (MISCELLANEOUS) ×1 IMPLANT
TUBING CONNECTING 10 (TUBING) ×1 IMPLANT
WATER STERILE IRR 500ML POUR (IV SOLUTION) ×1 IMPLANT

## 2023-02-05 NOTE — Op Note (Signed)
Preoperative diagnosis: Right breast carcinoma.  Postoperative diagnosis: Same.   Procedure: RF tag-localized right breast partial mastectomy x2                     Right axillary Sentinel Lymph node biopsy  Anesthesia: GETA  Surgeon: Dr. Benjamine Sprague  Wound Classification: Clean  Indications: Patient is a 77 y.o. female with a nonpalpable right breast mass noted on mammography with core biopsy demonstrating cancer.  Subsequent MRI showed 1 additional lesion seen diagnosis.  Requires RF localizer placement, partial mastectomy x 2 for treatment with sentinel lymph node biopsy.   Specimen: Anterior and posterior breast mass, Sentinel Lymph nodes x 1, medial margin of posterior breast mass  Complications: None  Estimated Blood Loss: 51m  Findings: 1. Specimen mammography shows marker and RF localizer on specimen 2. Pathology call refers gross examination of margins was negative for anterior mass, medial margin involvement in posterior mass.  Additional medial margin resection was negative for positive margins 3. No other palpable mass or lymph node identified.   Operation performed with curative intent:Yes  Tracer(s) used to identify sentinel nodes in the upfront surgery (non-neoadjuvant) setting (select all that apply):Radioactive Tracer  Tracer(s) used to identify sentinel nodes in the neoadjuvant setting (select all that apply):N/A  All nodes (colored or non-colored) present at the end of a dye-filled lymphatic channel were removed:N/A  All significantly radioactive nodes were removed:Yes  All palpable suspicious nodes were removed:N/A  Biopsy-proven positive nodes marked with clips prior to chemotherapy were identified and removed:N/A    Description of procedure: RF localization was performed by radiology prior to procedure. In the nuclear medicine suite, the subareolar region was injected with Tc-99 sulfur colloid the morning of procedure. Localization studies were  reviewed. The patient was taken to the operating room and placed supine on the operating table, and after general anesthesia the right breast and axilla were prepped and draped in the usual sterile fashion. A time-out was completed verifying correct patient, procedure, site, positioning, and implant(s) and/or special equipment prior to beginning this procedure.  By identifying the RF localizer, the probable trajectory and location of the mass was visualized. A skin incision was planned in such a way as to minimize the amount of dissection to reach the mass.  The skin incision was made after infusion of local. Flaps were raised and  Sharp and blunt dissection was then taken down to the mass, taking care to include the entire RF localizer and a margin of grossly normal tissue. The specimen was removed. The specimen was oriented with paint. Imaging reviewed and the entire target lesion had been resected, with biopsy clip and localizer within the specimen.Gross margin analysis by pathology confirmed all margins cleared on initial inspection.  Posterior mass addressed next. By identifying the RF localizer, the probable trajectory and location of the mass was visualized. A skin incision was planned in such a way as to minimize the amount of dissection to reach the mass.  The skin incision was made after infusion of local. Flaps were raised and  Sharp and blunt dissection was then taken down to the mass, taking care to include the entire RF localizer and a margin of grossly normal tissue. The specimen was removed. The specimen was oriented with paint. Imaging reviewed and the entire target lesion had been resected, with biopsy clip and localizer within the specimen.Gross margin analysis by pathology confirmed all margins cleared except for medial margin.  Medial margin was then excised,  oriented with paint, and sent to pathology and they confirmed no additional concerning areas.  A hand-held gamma probe was used  to identify the location of the hottest spot in the axilla. An incision was made around the caudal axillary hairline. Sharp and blunt Dissection was carried down to subdermal facias. The probe was placed within wound and again, the point of maximal count was found. Dissection continue until nodule was identified. The probe was placed in contact with the node and 5000 counts were recorded. The node was excised in its entirety. Ex vivo, the node measured 5100 counts when placed on the probe. The bed of the node measured 100 counts. No additional hot spots were identified. No clinically abnormal nodes were palpated.    All wounds irrigated, hemostasis was achieved and the wound closed in layers with  interrupted sutures of 3-0 Vicryl in deep dermal layer and a running subcuticular suture of Monocryl 4-0, then dressed with dermabond. The patient tolerated the procedure well and was taken to the postanesthesia care unit in stable condition. Sponge and instrument count correct at end of procedure.

## 2023-02-05 NOTE — H&P (Signed)
Subjective:   CC: Malignant neoplasm of upper-outer quadrant of right breast in female, estrogen receptor positive [C50.411, Z17.0] HPI: Emma Torres is a 77 y.o. female who was referred by Velna Ochs, MD for evaluation of above. Change was noted on last screening mammogram. Previous hx of biopsy in same breast decades ago. Benign. Asymptomatic.  Past Medical History: has a past medical history of Diverticulosis, History of colonic polyps, Hypercholesterolemia, Hypertension, and Type 2 diabetes mellitus with diabetic chronic kidney disease (CMS-HCC).  Past Surgical History: has a past surgical history that includes Tonsillectomy; Tubal ligation; Hysterectomy; Right shoulder dislocation; Cataract extraction w/ intraocular lens implant (Left, 10/2017); Cataract extraction w/ intraocular lens implant (Right, 12/2017); Colonoscopy (11/07/2004); Colonoscopy (07/18/2008, 09/22/2013); and Colonoscopy (08/29/2018).  Family History: family history includes Breast cancer in an other family member; Colon polyps in her brother, father, and mother; Diabetes in an other family member; Diabetes type II in her father; Heart failure in her mother; High blood pressure (Hypertension) in her father, mother, and another family member; Prostate cancer in her father; Stroke in an other family member; Uterine cancer in an other family member.  Social History: reports that she has never smoked. She has never used smokeless tobacco. She reports that she does not drink alcohol and does not use drugs.  Current Medications: has a current medication list which includes the following prescription(s): acetaminophen, aspirin, calcium carbonate-vitamin d3, cyanocobalamin, glipizide, herbal supplement, januvia, lisinopril-hydrochlorothiazide, metformin, metoprolol succinate, multivitamin, onetouch ultra blue test strip, onetouch ultra2 meter, pravastatin, and lancing device with lancets.  Allergies:  Allergies as of  12/22/2022 - Reviewed 12/22/2022  Allergen Reaction Noted  Adhesive Rash 11/01/2017   ROS:  A 15 point review of systems was performed and was negative except as noted in HPI  Objective:    BP (!) 160/72  Pulse 68  Ht 167.6 cm (5' 6"$ )  Wt 97.5 kg (215 lb)  BMI 34.70 kg/m   Constitutional : No distress, cooperative, alert  Lymphatics/Throat: Supple with no lymphadenopathy  Respiratory: Clear to auscultation bilaterally  Cardiovascular: Regular rate and rhythm  Gastrointestinal: Soft, non-tender, non-distended, no organomegaly.  Musculoskeletal: Steady gait and movement  Skin: Cool and moist,  Psychiatric: Normal affect, non-agitated, not confused  Breast: Normal appearance and no palpable abnormality in bilateral breasts and axilla. Chaperone present for exam.    LABS:  Reason for Addendum #1: Breast Biomarker Results   Specimen Submitted:  A. Breast, right   Clinical History: Subtle distortion in posterior upper right breast seen  at mammography with no [place] correlate.   DIAGNOSIS:  A. 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  - IN SITU CARCINOMA, DEFINITIVE CLASSIFICATION DEFERRED TO EXCISION.  Size of invasive carcinoma: 7 mm in this sample  Histologic grade of invasive carcinoma: Grade 2  Glandular/tubular differentiation score: 3  Nuclear pleomorphism score: 2  Mitotic rate score: 1  Total score: 6  Lymphovascular invasion: Not identified   Comment:  The definitive grade will be assigned on the excisional specimen.  ER/PR/HER2: Immunohistochemistry will be performed on block A3, with  reflex to [place] for HER2 2+. The results will be reported in an addendum.   GROSS DESCRIPTION:  A. Labeled: Right breast stereo distortion subtle posterior upper right  Received: in a formalin-filled [place] collection device  Specimen radiograph image(s) available for review  Time/Date  in fixative: Collected at 8:21 AM on 11/30/2022 and placed in  formalin at 8:24  AM on 11/30/2022  Cold ischemic time: Less than 5 minutes  Total fixation time: Approximately 11.75 hours  Core pieces: Multiple  Measurement: Aggregate, 7 x 1 x 0.3 cm  Description / comments: Received are cores and fragments of yellow  fibrofatty tissue. A diagram is not received on the container or with  the requisition.  Inked: Blue  Entirely submitted in cassette(s):   1 - sections A and B  2 - sections C and D  3 - sections E and F  4 - sections G and H  5 - sections I and J  6 - sections K and L with remaining free-floating fragments   RB 11/30/2022   Final Diagnosis performed by Quay Burow, MD. Electronically signed  12/01/2022 2:19:03PM  The electronic signature indicates that the named Attending Pathologist  has evaluated the specimen  Technical component performed at [facility], [address] [place],  [place] [address] Lab: [phone] Dir: Rush Farmer, MD, MMM  Professional component performed at [facility], [facility], [place], [place], [place] [address] Lab: [phone]  Dir: Kathi Simpers, MD   ADDENDUM:  CASE SUMMARY: BREAST BIOMARKER TESTS  Estrogen Receptor (ER) Status: POSITIVE  Percentage of cells with nuclear positivity: Greater than 90%  Average intensity of staining: Strong   Progesterone Receptor (PgR) Status: POSITIVE  Percentage of cells with nuclear positivity: Greater than 90%  Average intensity of staining: Strong   HER2 (by immunohistochemistry): NEGATIVE (Score 0)   Ki-67: Not performed   Cold Ischemia and Fixation Times: Meet requirements specified in latest  version of the ASCO/CAP guidelines  Testing Performed on Block Number(s): A3   METHODS  Fixative: Formalin  Estrogen Receptor: FDA cleared ([place]) Primary Antibody: SP1  Progesterone Receptor: FDA cleared ([place]) Primary Antibody: 1E2  HER2 (by IHC): FDA approved  ([place]) Primary Antibody: 4B5 ([facility])  Immunohistochemistry controls worked appropriately. Slides were prepared  by Launa Grill, [place], and interpreted by Quay Burow, MD.  (v1.5.0.1)     RADS: CLINICAL DATA: BI-RADS 3 follow-up LEFT breast mass at 12 o'clock  noted sonographically, initiated March 2021   EXAM:  DIGITAL DIAGNOSTIC BILATERAL MAMMOGRAM WITH TOMOSYNTHESIS;  ULTRASOUND LEFT BREAST LIMITED; ULTRASOUND RIGHT BREAST LIMITED   TECHNIQUE:  Bilateral digital diagnostic mammography and breast tomosynthesis  was performed.; Targeted ultrasound examination of the left breast  was performed.; Targeted ultrasound examination of the right breast  was performed   COMPARISON: Previous exam(s).   ACR Breast Density Category c: The breast tissue is heterogeneously  dense, which may obscure small masses.   FINDINGS:  Diagnostic images of the RIGHT breast demonstrate an area of subtle  possible architectural distortion in the RIGHT upper breast at  posterior depth. It is best seen on CC slice 37 and spot CC slice  30. It is immediately adjacent to a coarse calcification. There is a  possible correlate noted on ML slice 37. Patient has a history of  excisional biopsy in the lower aspect of her breast.   Diagnostic images of the LEFT breast demonstrate an oval mass in the  LEFT outer breast at middle depth. This is best seen on CC volume 1  slice 46 and CC volume 2 slice 45. This is not definitively stable  in comparison to priors. There is a stable oval circumscribed mass  noted on MLO imaging in the far posterior LEFT breast. Definitive  mammographic correlate for previously described mass at 12 o'clock  is not identified.   Targeted  ultrasound was performed of the RIGHT upper breast. No  definitive correlate for area of possible subtle architectural  distortion is identified. No suspicious cystic or solid mass is  seen.   Targeted ultrasound was performed of the  LEFT upper breast. At 12  o'clock 4 cm from nipple, there is revisualization of an oval  circumscribed hypoechoic mass. It measures 6 x 2 by 6 mm, stable for  greater than 2 years and consistent with a benign etiology.   Targeted ultrasound performed of the LEFT outer breast. At 3 o'clock  2 cm from the nipple, there is an oval circumscribed hypoechoic  mass. It measures 14 x 7 x 11 mm and is favored to correspond to the  mass noted mammographically.   IMPRESSION:  1. There is an area of possible subtle architectural distortion in  the RIGHT upper breast at posterior depth. Recommend stereotactic  guided biopsy for definitive characterization.  2. There is a 14 mm mass in the LEFT breast at 3 o'clock 2 cm from  the nipple. This likely reflects a complicated cyst. Given  hypoechoic appearance on today's exam, recommend ultrasound-guided  aspiration for definitive characterization.  3. Stable LEFT breast mass at 12 o'clock for greater than 2 years,  consistent with a benign etiology.   RECOMMENDATION:  RIGHT breast stereotactic guided biopsy x1   LEFT breast ultrasound-guided aspiration with potential conversion  to biopsy x1   I have discussed the findings and recommendations with the patient.  The biopsy procedure was discussed with the patient and questions  were answered. Patient expressed their understanding of the biopsy  recommendation. Patient will be scheduled for biopsy at her earliest  convenience by the schedulers. Ordering provider will be notified.  If applicable, a reminder letter will be sent to the patient  regarding the next appointment.   BI-RADS CATEGORY 4: Suspicious.   Assessment:   Malignant neoplasm of upper-outer quadrant of right breast in female, estrogen receptor positive [C50.411, Z17.0]  Plan:    1. Malignant neoplasm of upper-outer quadrant of right breast in female, estrogen receptor positive [C50.411, Z17.0] Discussed the risk of surgery  including recurrence, chronic pain, post-op infxn, poor/delayed wound healing, poor cosmesis, seroma, hematoma formation, and possible re-operation to address said risks. The risks of general anesthetic, if used, includes MI, CVA, sudden death or even reaction to anesthetic medications also discussed.  Typical post-op recovery time and possbility of activity restrictions were also discussed. Alternatives include continued observation. Benefits include possible symptom relief, pathologic evaluation, and/or curative excision.   The patient verbalized understanding and all questions were answered to the patient's satisfaction.  2. Patient has elected to proceed with surgical treatment. Procedure will be scheduled. Right lumpectomy, SLNB. Localizer placement.  labs/images/medications/previous chart entries reviewed personally and relevant changes/updates noted above.   UPDATE: MRI recommended by oncology due to lobular features. F/u MRI biopsy noted new spot of invasive ductal with lobular features. Will proceed with lumpectomy x 2 and sentinel lymph node biopsy of the right side.  Confirm with radiology department that the previous RF tag placed will still be active and be detectable at the time of surgery. Patient will need to have a second RF tag placed at the newer biopsy site prior to the surgery. Risks benefits alternatives discussed again and patient has no additional questions or concerns at this time.  Malignant neoplasm of upper-outer quadrant of right breast in female, estrogen receptor positive [C50.411, Z17.0]

## 2023-02-05 NOTE — Anesthesia Preprocedure Evaluation (Signed)
Anesthesia Evaluation  Patient identified by MRN, date of birth, ID band Patient awake    Reviewed: Allergy & Precautions, H&P , NPO status , Patient's Chart, lab work & pertinent test results, reviewed documented beta blocker date and time   History of Anesthesia Complications Negative for: history of anesthetic complications  Airway Mallampati: II   Neck ROM: full    Dental  (+) Poor Dentition, Dental Advidsory Given   Pulmonary neg pulmonary ROS   Pulmonary exam normal        Cardiovascular hypertension, (-) angina (-) Past MI and (-) Cardiac Stents Normal cardiovascular exam(-) dysrhythmias (-) Valvular Problems/Murmurs Rhythm:regular Rate:Normal     Neuro/Psych negative neurological ROS  negative psych ROS   GI/Hepatic negative GI ROS, Neg liver ROS,,,  Endo/Other  diabetes, Well Controlled, Type 2, Oral Hypoglycemic Agents    Renal/GU CRFRenal disease  negative genitourinary   Musculoskeletal   Abdominal   Peds  Hematology negative hematology ROS (+)   Anesthesia Other Findings Past Medical History: No date: Arthritis No date: Chronic kidney disease No date: Cough     Comment:  chronic No date: Diabetes mellitus without complication (HCC) No date: Hypertension No date: IBS (irritable bowel syndrome) Past Surgical History: No date: ABDOMINAL HYSTERECTOMY 2011: BREAST EXCISIONAL BIOPSY; Right     Comment:  negative 11/18/2017: CATARACT EXTRACTION W/PHACO; Left     Comment:  Procedure: CATARACT EXTRACTION PHACO AND INTRAOCULAR               LENS PLACEMENT (IOC);  Surgeon: Eulogio Bear, MD;                Location: [facility] ORS;  Service: Ophthalmology;  Laterality:              Left;  Lot # [id number] H [place]: 00:28.6 AP%: 10.8 CDE:               3.10  01/13/2018: CATARACT EXTRACTION W/PHACO; Right     Comment:  Procedure: CATARACT EXTRACTION PHACO AND INTRAOCULAR               LENS PLACEMENT (IOC);   Surgeon: Eulogio Bear, MD;                Location: [facility] ORS;  Service: Ophthalmology;  Laterality:              Right;  cassette lot #  [id number]  [place]   00:29.4 AP%                 9.6 CDE2.82 No date: TONSILLECTOMY No date: TUBAL LIGATION BMI    Body Mass Index:  35.68 kg/m     Reproductive/Obstetrics negative OB ROS                             Anesthesia Physical Anesthesia Plan  ASA: 3  Anesthesia Plan: General   Post-op Pain Management:    Induction: Intravenous  PONV Risk Score and Plan: 3 and Ondansetron, Dexamethasone, Midazolam and Treatment may vary due to age or medical condition  Airway Management Planned: LMA  Additional Equipment:   Intra-op Plan:   Post-operative Plan: Extubation in OR  Informed Consent: I have reviewed the patients History and Physical, chart, labs and discussed the procedure including the risks, benefits and alternatives for the proposed anesthesia with the patient or authorized representative who has indicated his/her understanding and acceptance.     Dental Advisory Given  Plan Discussed with: CRNA  Anesthesia Plan Comments:         Anesthesia Quick Evaluation

## 2023-02-05 NOTE — Anesthesia Procedure Notes (Signed)
Procedure Name: LMA Insertion Date/Time: 02/05/2023 10:16 AM  Performed by: Kelton Pillar, CRNAPre-anesthesia Checklist: Patient identified, Emergency Drugs available, Suction available and Patient being monitored Patient Re-evaluated:Patient Re-evaluated prior to induction Oxygen Delivery Method: Circle system utilized Preoxygenation: Pre-oxygenation with 100% oxygen Induction Type: IV induction Placement Confirmation: positive ETCO2, CO2 detector and breath sounds checked- equal and bilateral Tube secured with: Tape Dental Injury: Teeth and Oropharynx as per pre-operative assessment

## 2023-02-05 NOTE — Discharge Instructions (Addendum)
AMBULATORY SURGERY  DISCHARGE INSTRUCTIONS   The drugs that you were given will stay in your system until tomorrow so for the next 24 hours you should not:  Drive an automobile Make any legal decisions Drink any alcoholic beverage   You may resume regular meals tomorrow.  Today it is better to start with liquids and gradually work up to solid foods.  You may eat anything you prefer, but it is better to start with liquids, then soup and crackers, and gradually work up to solid foods.   Please notify your doctor immediately if you have any unusual bleeding, trouble breathing, redness and pain at the surgery site, drainage, fever, or pain not relieved by medication.    Additional Instructions: Removal, Care After This sheet gives you information about how to care for yourself after your procedure. Your health care provider may also give you more specific instructions. If you have problems or questions, contact your health care provider. What can I expect after the procedure? After the procedure, it is common to have: Soreness. Bruising. Itching. Follow these instructions at home: site care Follow instructions from your health care provider about how to take care of your site. Make sure you: Wash your hands with soap and water before and after you change your bandage (dressing). If soap and water are not available, use hand sanitizer. Leave stitches (sutures), skin glue, or adhesive strips in place. These skin closures may need to stay in place for 2 weeks or longer. If adhesive strip edges start to loosen and curl up, you may trim the loose edges. Do not remove adhesive strips completely unless your health care provider tells you to do that. If the area bleeds or bruises, apply gentle pressure for 10 minutes. OK TO SHOWER IN 24HRS  Check your site every day for signs of infection. Check for: Redness, swelling, or pain. Fluid or blood. Warmth. Pus or a bad smell.  General  instructions Rest and then return to your normal activities as told by your health care provider.  tylenol and advil as needed for discomfort.  Please alternate between the two every four hours as needed for pain.    Use narcotics, if prescribed, only when tylenol and motrin is not enough to control pain.  325-686m every 8hrs to max of 3005m24hrs (including the 32573mn every norco dose) for the tylenol.    Advil up to 400m58mr dose every 8hrs as needed for pain.   Keep all follow-up visits as told by your health care provider. This is important. Contact a health care provider if: You have redness, swelling, or pain around your site. You have fluid or blood coming from your site. Your site feels warm to the touch. You have pus or a bad smell coming from your site. You have a fever. Your sutures, skin glue, or adhesive strips loosen or come off sooner than expected. Get help right away if: You have bleeding that does not stop with pressure or a dressing. Summary After the procedure, it is common to have some soreness, bruising, and itching at the site. Follow instructions from your health care provider about how to take care of your site. Check your site every day for signs of infection. Contact a health care provider if you have redness, swelling, or pain around your site, or your site feels warm to the touch. Keep all follow-up visits as told by your health care provider. This is important. This information is not intended to replace  advice given to you by your health care provider. Make sure you discuss any questions you have with your health care provider. Document Released: 01/03/2016 Document Revised: 06/06/2018 Document Reviewed: 06/06/2018 Elsevier Interactive Patient Education  2019 [place].         Please contact your physician with any problems or Same Day Surgery at [phone], Monday through Friday 6 am to 4 pm, or [place] at [facility] number at  [phone].

## 2023-02-05 NOTE — Transfer of Care (Signed)
Immediate Anesthesia Transfer of Care Note  Patient: Emma Torres  Procedure(s) Performed: BREAST LUMPECTOMY,RADIO FREQ LOCALIZER,AXILLARY SENTINEL LYMPH NODE BIOPSY (Right: Breast)  Patient Location: PACU  Anesthesia Type:General  Level of Consciousness: awake, drowsy, and patient cooperative  Airway & Oxygen Therapy: Patient Spontanous Breathing and Patient connected to face mask oxygen  Post-op Assessment: Report given to RN and Post -op Vital signs reviewed and stable  Post vital signs: Reviewed and stable  Last Vitals:  Vitals Value Taken Time  BP 159/84 02/05/23 1218  Temp 36.6 C 02/05/23 1218  Pulse 70 02/05/23 1223  Resp 17 02/05/23 1223  SpO2 95 % 02/05/23 1223  Vitals shown include unvalidated device data.  Last Pain:  Vitals:   02/05/23 1218  TempSrc:   PainSc: 0-No pain         Complications: No notable events documented.

## 2023-02-05 NOTE — Interval H&P Note (Signed)
History and Physical Interval Note:  02/05/2023 10:08 AM  Emma Torres  has presented today for surgery, with the diagnosis of Malignant neoplasm of upper-outer quadrant of right breast in female, estrogen receptor positive C50.411, Z17.0.  The various methods of treatment have been discussed with the patient and family. After consideration of risks, benefits and other options for treatment, the patient has consented to  Procedure(s): [name] (Right) as a surgical intervention.  The patient's history has been reviewed, patient examined, no change in status, stable for surgery.  I have reviewed the patient's chart and labs.  Questions were answered to the patient's satisfaction.     Brenner Visconti [name]

## 2023-02-08 ENCOUNTER — Encounter: Payer: Self-pay | Admitting: Surgery

## 2023-02-09 LAB — SURGICAL PATHOLOGY

## 2023-02-10 ENCOUNTER — Telehealth: Payer: Self-pay

## 2023-02-10 NOTE — Telephone Encounter (Signed)
Oncotype Dx testing request submitted via Oncotype portal on Specimen [id number]. Surgical path report, demographic sheet and insurance info uploaded to portal.   [id number]

## 2023-02-11 ENCOUNTER — Other Ambulatory Visit: Payer: Self-pay | Admitting: Anatomic Pathology & Clinical Pathology

## 2023-02-11 NOTE — Anesthesia Postprocedure Evaluation (Signed)
Anesthesia Post Note  Patient: Emma Torres  Procedure(s) Performed: BREAST LUMPECTOMY,RADIO FREQ LOCALIZER,AXILLARY SENTINEL LYMPH NODE BIOPSY (Right: Breast)  Patient location during evaluation: PACU Anesthesia Type: General Level of consciousness: awake and alert Pain management: pain level controlled Vital Signs Assessment: post-procedure vital signs reviewed and stable Respiratory status: spontaneous breathing, nonlabored ventilation, respiratory function stable and patient connected to nasal cannula oxygen Cardiovascular status: blood pressure returned to baseline and stable Postop Assessment: no apparent nausea or vomiting Anesthetic complications: no   No notable events documented.   Last Vitals:  Vitals:   02/05/23 1251 02/05/23 1303  BP: (!) 150/77 (!) 155/69  Pulse: 65 64  Resp: 20 15  Temp: (!) 36.2 C (!) 36.2 C  SpO2: 96% 93%    Last Pain:  Vitals:   02/06/23 1031  TempSrc:   PainSc: 2                  Martha Clan

## 2023-02-11 NOTE — Telephone Encounter (Signed)
Can we get her scheduled to see Dr. Tasia Catchings in about 2 1/2 weeks for follow up post surgery?

## 2023-02-19 ENCOUNTER — Encounter: Payer: Self-pay | Admitting: Oncology

## 2023-02-22 NOTE — Telephone Encounter (Signed)
Oncotype report available in media.

## 2023-03-02 ENCOUNTER — Encounter: Payer: Self-pay | Admitting: Oncology

## 2023-03-02 ENCOUNTER — Telehealth: Payer: Self-pay

## 2023-03-02 ENCOUNTER — Inpatient Hospital Stay: Payer: Medicare Other | Attending: Oncology | Admitting: Oncology

## 2023-03-02 VITALS — BP 158/67 | HR 64 | Temp 97.6°F | Resp 18 | Wt 208.6 lb

## 2023-03-02 DIAGNOSIS — Z170 Estrogen receptor positive status [ER+]: Secondary | ICD-10-CM | POA: Diagnosis not present

## 2023-03-02 DIAGNOSIS — Z807 Family history of other malignant neoplasms of lymphoid, hematopoietic and related tissues: Secondary | ICD-10-CM | POA: Insufficient documentation

## 2023-03-02 DIAGNOSIS — Z809 Family history of malignant neoplasm, unspecified: Secondary | ICD-10-CM | POA: Diagnosis not present

## 2023-03-02 DIAGNOSIS — Z803 Family history of malignant neoplasm of breast: Secondary | ICD-10-CM | POA: Insufficient documentation

## 2023-03-02 DIAGNOSIS — C50919 Malignant neoplasm of unspecified site of unspecified female breast: Secondary | ICD-10-CM | POA: Diagnosis not present

## 2023-03-02 DIAGNOSIS — C50911 Malignant neoplasm of unspecified site of right female breast: Secondary | ICD-10-CM | POA: Insufficient documentation

## 2023-03-02 DIAGNOSIS — I129 Hypertensive chronic kidney disease with stage 1 through stage 4 chronic kidney disease, or unspecified chronic kidney disease: Secondary | ICD-10-CM | POA: Diagnosis not present

## 2023-03-02 DIAGNOSIS — N1832 Chronic kidney disease, stage 3b: Secondary | ICD-10-CM | POA: Insufficient documentation

## 2023-03-02 DIAGNOSIS — Z8049 Family history of malignant neoplasm of other genital organs: Secondary | ICD-10-CM | POA: Insufficient documentation

## 2023-03-02 DIAGNOSIS — Z8042 Family history of malignant neoplasm of prostate: Secondary | ICD-10-CM | POA: Insufficient documentation

## 2023-03-02 DIAGNOSIS — Z90710 Acquired absence of both cervix and uterus: Secondary | ICD-10-CM | POA: Insufficient documentation

## 2023-03-02 DIAGNOSIS — N1831 Chronic kidney disease, stage 3a: Secondary | ICD-10-CM

## 2023-03-02 NOTE — Assessment & Plan Note (Signed)
Encourage oral hydration and avoid nephrotoxins.   

## 2023-03-02 NOTE — Telephone Encounter (Signed)
Oncotype Dx testing request submitted via Oncotype portal on Specimen [id number] (specimen A. Breast, right anterior). Surgical path report, demographic sheet and insurance info uploaded to portal.    [id number]

## 2023-03-02 NOTE — Assessment & Plan Note (Addendum)
Patient had genetic testing done and is negative for pathological mutation.

## 2023-03-02 NOTE — Progress Notes (Signed)
Hematology/Oncology Consult Note [phone] [phone]  CHIEF COMPLAINTS/PURPOSE OF CONSULTATION:  Right breast invasive mammary carcinoma.   ASSESSMENT & PLAN:   Invasive carcinoma of breast [facility]) Surgical pathology results were reviewed with patient.  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  Positive posterior margin. Oncotype DX was sent on posterior breast mass, recurrence score is 12, chemo benefit less than 1%.  She does not need adjuvant chemotherapy.  Will obtain Oncotype DX on anterior breast mass We discussed about the standard of care of reexcision.  Given her age and other medical conditions, we discussed the alternative of omitting re-excision. Patient is interested in reexcision, she request to be sent to another surgeon for second opinion.  Refer to Dr. Marlou Starks.   We reviewed the plan of adjuvant radiation and adjuvant endocrine therapy.  Will refer to radiation oncology once per anterior breast mass Oncotype DX results.  Family history of cancer Patient had genetic testing done and is negative for pathological mutation.  CKD (chronic kidney disease) stage 3, GFR 30-59 ml/min (HCC) Encourage oral hydration and avoid nephrotoxins.   I had a lengthy discussion with patient and her daughters.  They have multiple questions and all were answered to their satisfaction. Orders Placed This Encounter  Procedures   Ambulatory referral to General Surgery    Referral Priority:   Routine    Referral Type:   Surgical    Referral Reason:   Specialty Services Required    Referred to Provider:   Jovita Kussmaul, MD    Requested Specialty:   General Surgery    Number of Visits Requested:   1   Follow-up to be determined.  Earlie Server, MD, PhD [facility] Hematology Oncology 03/02/2023   HISTORY OF PRESENTING ILLNESS:  Emma Torres 77 y.o. female presents to establish care for Right breast invasive mammary carcinoma I have reviewed her chart and materials related  to her cancer extensively and collaborated history with the patient. Summary of oncologic history is as follows: Oncology History  Invasive carcinoma of breast ([address])  10/24/2021 Mammogram   Bilateral diagnostic mammogram 1.  Stable appearance of probably benign left breast mass. 2.  No findings of malignancy in either breast.   11/09/2022 Mammogram   Bilateral diagnostic mammogram and [place] bilateral breast  1. There is an area of possible subtle architectural distortion in the RIGHT upper breast at posterior depth. Recommend stereotactic guided biopsy for definitive characterization. 2. There is a 14 mm mass in the LEFT breast at 3 o'clock 2 cm rom the nipple. This likely reflects a complicated cyst. Given hypoechoic appearance on today's exam, recommend ultrasound-guidedaspiration for definitive characterization. 3. Stable LEFT breast mass at 12 o'clock for greater than 2 years, consistent with a benign etiology.   12/23/2022 Initial Diagnosis   Invasive carcinoma of breast  Patient gets mammogram surveillance for left breast mass which has been stable. Recent mammogram showed right breast distortion.   11/30/2022 Right breast stereotactic biopsy showed Invasive mammary carcinoma with mixed lobular and ductal features, Grade 2, no LVI, ER 90%+, PR 90%+, HER2 - [facility] 0]   Menarche at age of 49 First live birth at age of 26 OCP use: denies History of hysterectomy: yes Menopausal status: postmenopausal History of HRT use: 2 years History of chest radiation: denies Number of previous breast biopsies:  right breast.    12/23/2022 Cancer Staging   Staging form: Breast, AJCC 8th Edition - Clinical: Stage Unknown (cTX, cN0, cM0, G2, ER+, PR+,  HER2-) - Signed by Earlie Server, MD on 12/23/2022 Histologic grading system: 3 grade system    Genetic Testing   No pathogenic variants identified on the [facility] Multi-Cancer+RNA panel. 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 The report date is  01/30/2023.  The Multi-Cancer + RNA Panel offered by [facility] includes sequencing and/or deletion/duplication analysis of the following 70 genes:  AIP*, ALK, APC*, ATM*, AXIN2*, BAP1*, BARD1*, BLM*, BMPR1A*, BRCA1*, BRCA2*, BRIP1*, CDC73*, CDH1*, CDK4, CDKN1B*, CDKN2A, CHEK2*, CTNNA1*, DICER1*, EPCAM, EGFR, FH*, FLCN*, GREM1, HOXB13, KIT, LZTR1, MAX*, MBD4, MEN1*, MET, MITF, MLH1*, MSH2*, MSH3*, MSH6*, MUTYH*, NF1*, NF2*, NTHL1*, PALB2*, PDGFRA, PMS2*, POLD1*, POLE*, POT1*, PRKAR1A*, PTCH1*, PTEN*, RAD51C*, RAD51D*, RB1*, RET, SDHA*, SDHAF2*, SDHB*, SDHC*, SDHD*, SMAD4*, SMARCA4*, SMARCB1*, SMARCE1*, STK11*, SUFU*, TMEM127*, TP53*, TSC1*, TSC2*, VHL*. RNA analysis is performed for * genes.   12/29/2022 Imaging   Bilateral MRI breast w wo contrast  1. Indeterminate 0.6 cm mass involving the UPPER INNER QUADRANT of the RIGHT breast at anterior depth. 2. Indeterminate 1.7 cm linear non-mass enhancement involving the LOWER OUTER QUADRANT of the LEFT breast at middle to posterior depth. 3. Indeterminate 1.8 cm mass involving the LOWER OUTER QUADRANT of the LEFT breast at far posterior depth. 4. No pathologic lymphadenopathy. 5. 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   01/25/2023 Procedure   Patient underwent MRI biopsy of right breast upper inner quadrant anterior depth as well as non-mass enhancement in the lower outer quadrant left breast and indeterminate mass involving lower outer quadrant of the left breast.  Pathology showed 1. Breast, right, needle core biopsy, upper anterior INVASIVE MODERATELY DIFFERENTIATED DUCTAL CARCINOMA WITH LOBULAR FEATURES, GRADE 2 (3+2+1) FOCAL DUCTAL CARCINOMA IN SITU, INTERMEDIATE NUCLEAR GRADE, SOLID TYPE WITHOUT NECROSIS TUBULE FORMATION: SCORE 3 NUCLEAR PLEOMORPHISM: SCORE 2 MITOTIC COUNT: SCORE 1 TOTAL SCORE: 6 OF 9 OVERALL GRADE: GRADE 2 RARE MICROCALCIFICATION PRESENT WITHIN BENIGN  DUCT NEGATIVE FOR ANGIOLYMPHATIC INVASION TUMOR MEASURES 4 MM IN GREATEST LINEAR EXTENT ER 95% positive, PR 90% positive, Ki-67 5%.  HER2 negative [IHC1+].  2. Breast, left, needle core biopsy, lateral BENIGN BREAST WITH FIBROCYSTIC CHANGES INCLUDING STROMAL FIBROSIS, APOCRINE METAPLASIA, ADENOSIS AND USUAL DUCT HYPERPLASIA MICROCALCIFICATIONS PRESENT WITHIN DUCT HYPERPLASIA AND ADENOSIS NEGATIVE FOR CARCINOMA  3. Breast, left, needle core biopsy, LOQ CONSISTENT WITH BENIGN FIBROADENOMA ADJACENT FIBROCYSTIC CHANGES INCLUDING STROMAL FIBROSIS AND ADENOSIS RARE MICROCALCIFICATION WITHIN A FIBROADENOMA NEGATIVE FOR CARCINOMA    02/05/2023 Surgery   Patient underwent right breast lumpectomy of right anterior mass, right posterior mass and right medial margin reexcision with sentinel lymph node biopsy Pathology showed pT2 pN0   A.  BREAST, RIGHT, ANTERIOR MASS; EXCISION:- MULTIPLE FOCI (at least 6) OF INVASIVE DUCTAL CARCINOMA WITH LOBULAR FEATURES, LARGEST FOCI 6.2 MM; MARGINS NEGATIVE.  B.  BREAST, RIGHT, POSTERIOR MASS; EXCISION: - MULTIFOCAL INVASIVE DUCTAL CARCINOMA WITH LOBULAR FEATURES, LARGEST FOCI 21 mm, LARGEST FOCI FOCALLY EXTENDS TO MEDIAL MARGIN (REEXCISED IN C), SEPARATE 1 MM FOCI EXTENDS TO INFERIOR MARGIN [Oncotype DX recurrence score of this mass is 12, no chemotherapy benefit.]  C.  BREAST, RIGHT, MEDIAL MARGIN, REEXCISION: - FOCI OF INVASIVE DUCTAL CARCINOMA WITH LOBULAR FEATURES 2.5 mm, NOT AT MARGIN.  D.  RIGHT SENTINEL LYMPH NODE; EXCISION: - NO EVIDENCE OF METASTATIC CARCINOMA IN 1 SENTINEL LYMPH NODE   TUMOR  Histologic Type: Invasive ductal carcinoma with lobular features.  Histologic Grade ([place] Histologic Score)       Glandular (Acinar)/Tubular Differentiation: 3 (of 3)  Nuclear Pleomorphism: 2 (of 3)       Mitotic Rate: 1 (26f3)       Overall Grade: 2 (of 3)  Tumor Size: 6.2 mm  Tumor Focality: Multifocal  Ductal Carcinoma In Situ  (DCIS): Not identified  Tumor Extent: N/A  Lymphatic and/or Vascular Invasion: Not identified  Treatment Effect in the Breast: No known presurgical therapy   MARGINS  Margin Status for Invasive Carcinoma: All margins negative for invasive  carcinoma      Distance from closest margin: 1.6 mm       Specify closest margin: Posterior      03/02/2023 Cancer Staging   Staging form: Breast, AJCC 8th Edition - Pathologic: Stage IA (pT2, pN0, cM0, G2, ER+, PR+, HER2-, Oncotype DX score: 12) - Signed by YEarlie Server MD on 03/02/2023 Stage prefix: Initial diagnosis Multigene prognostic tests performed: Oncotype DX Recurrence score range: Greater than or equal to 11 Histologic grading system: 3 grade system    Patient has positive family history of breast cancer and uterine cancer.    Today she was accompanied by 2 daughters.  Patient is status post right breast lumpectomy.  She has some soreness at the surgical sites. She was seen by Dr. SLysle Pearland has had pathology reports reviewed.  She was upset that posterior margin was positive.  She request to be sent to a different surgeon for reexcision.   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     SURGICAL HISTORY: Past Surgical History:  Procedure Laterality Date   ABDOMINAL HYSTERECTOMY     BREAST BIOPSY Right 11/30/2022   MM RT BREAST BX W LOC DEV 1ST LESION IMAGE BX SPEC STEREO GUIDE 11/30/2022 [facility]   BREAST BIOPSY Right 12/24/2022   MM RT RADIO FREQUENCY TAG LOC MAMMO GUIDE 12/24/2022 [facility]   BREAST BIOPSY Right 02/02/2023   MM RT RADIO FREQUENCY TAG LOC MAMMO  GUIDE 02/02/2023 [facility]   breast biospy Right 11/30/2022   rt stereo asymetry, coil clip, path pending   BREAST CYST ASPIRATION Left 11/30/2022   BREAST EXCISIONAL BIOPSY Right 2011   negative   BREAST LUMPECTOMY,RADIO FREQ LOCALIZER,AXILLARY SENTINEL LYMPH NODE BIOPSY Right 02/05/2023   Procedure: BREAST LUMPECTOMY,RADIO FREQ LOCALIZER,AXILLARY SENTINEL LYMPH NODE BIOPSY;  Surgeon: SBenjamine Sprague DO;  Location: [facility];  Service: General;  Laterality: Right;   CATARACT EXTRACTION W/PHACO Left 11/18/2017   Procedure: CATARACT EXTRACTION PHACO AND INTRAOCULAR LENS PLACEMENT ([place];  Surgeon: KEulogio Bear MD;  Location: [facility];  Service: Ophthalmology;  Laterality: Left;  Lot # [id number] [place] 00:28.6 AP%: 10.8 CDE: 3.10    CATARACT EXTRACTION W/PHACO Right 01/13/2018   Procedure: CATARACT EXTRACTION PHACO AND INTRAOCULAR LENS PLACEMENT (IOC);  Surgeon: KEulogio Bear MD;  Location: [facility];  Service: Ophthalmology;  Laterality: Right;  cassette lot #  [id number]  [place]  00:29.4 AP%   9.6 CDE2.82   COLONOSCOPY     2005, 2009, 2014   COLONOSCOPY WITH PROPOFOL N/A 08/29/2018   Procedure: COLONOSCOPY WITH PROPOFOL;  Surgeon: EManya Silvas  MD;  Location: [facility];  Service: Endoscopy;  Laterality: N/A;   TONSILLECTOMY     TUBAL LIGATION     VAGINAL HYSTERECTOMY      SOCIAL HISTORY: Social History   Socioeconomic History   Marital status: Married    Spouse name: Pilar Plate   Number of children: 2   Years of education: Not on file   Highest education level: Not on file  Occupational History   Not on file  Tobacco Use   Smoking status: Never   Smokeless tobacco: Never  Vaping Use   Vaping Use: Never used  Substance and Sexual Activity   Alcohol use: No   Drug use: No   Sexual activity: Not on file  Other Topics Concern   Not on file  Social History Narrative   Not on file   Social Determinants of Health   Financial Resource Strain: Not on file  Food  Insecurity: Not on file  Transportation Needs: Not on file  Physical Activity: Not on file  Stress: Not on file  Social Connections: Not on file  Intimate Partner Violence: Not on file    FAMILY HISTORY: Family History  Problem Relation Age of Onset   Prostate cancer Brother 37   Multiple myeloma Brother 29   Breast cancer Maternal Aunt        dx 71s   Breast cancer Maternal Aunt        dx 20s   Uterine cancer Maternal Grandmother        dx 27s   Breast cancer Cousin        dx 58s    ALLERGIES:  is allergic to tape.  MEDICATIONS:  Current Outpatient Medications  Medication Sig Dispense Refill   acetaminophen (TYLENOL) 325 MG tablet Take 650 mg by mouth every 6 (six) hours as needed.     Biotin w/ Vitamins C & E (HAIR/SKIN/NAILS PO) Take 1 tablet by mouth daily.     Blood Glucose Monitoring Suppl (ONE TOUCH ULTRA 2) w/Device KIT See admin instructions.     Calcium Carbonate-Vitamin D (CALTRATE 600+D PO) Take 1 tablet by mouth daily.     cyanocobalamin (VITAMIN B12) 1000 MCG tablet Take 1,000 mcg by mouth daily.     glipiZIDE (GLUCOTROL) 10 MG tablet Take 20 mg by mouth 2 (two) times daily before a meal.      glucose blood (ONETOUCH ULTRA) test strip Use once daily E11.9     ibuprofen (ADVIL,MOTRIN) 200 MG tablet Take 200 mg by mouth every 6 (six) hours as needed for headache or moderate pain.     Lancets Misc. (UNISTIK 2 NORMAL) MISC Use 1 each once daily Use as instructed. One Touch Ultra lancets E11.9     lidocaine-prilocaine (EMLA) cream Apply topically.     lisinopril-hydrochlorothiazide (PRINZIDE,ZESTORETIC) 20-12.5 MG tablet Take 1 tablet by mouth daily.     Magnesium 250 MG TABS Take 250 mg by mouth daily.     metFORMIN (GLUCOPHAGE) 1000 MG tablet Take 1,000-1,500 mg by mouth See admin instructions. Take 1000 mg by mouth in the morning and take 1500 mg by mouth in the evening     metoprolol succinate (TOPROL-XL) 50 MG 24 hr tablet Take 50 mg by mouth daily. Take with or  immediately following a meal.     oxyCODONE-acetaminophen (PERCOCET) 5-325 MG tablet Take 1 tablet by mouth every 8 (eight) hours as needed for severe pain. 6 tablet 0   pravastatin (PRAVACHOL) 40 MG tablet Take 40 mg  by mouth daily.     sitaGLIPtin (JANUVIA) 100 MG tablet Take 100 mg by mouth daily.     No current facility-administered medications for this visit.    Review of Systems  Constitutional:  Negative for appetite change, chills, fatigue and fever.  HENT:   Negative for hearing loss and voice change.   Eyes:  Negative for eye problems.  Respiratory:  Negative for chest tightness and cough.   Cardiovascular:  Negative for chest pain.  Gastrointestinal:  Negative for abdominal distention, abdominal pain and blood in stool.  Endocrine: Negative for hot flashes.  Genitourinary:  Negative for difficulty urinating and frequency.   Musculoskeletal:  Negative for arthralgias.  Skin:  Negative for itching and rash.  Neurological:  Negative for extremity weakness.  Hematological:  Negative for adenopathy.  Psychiatric/Behavioral:  Negative for confusion.      PHYSICAL EXAMINATION: ECOG PERFORMANCE STATUS: 1 - Symptomatic but completely ambulatory  Vitals:   03/02/23 1416  BP: (!) 158/67  Pulse: 64  Resp: 18  Temp: 97.6 F (36.4 C)  SpO2: 98%   Filed Weights   03/02/23 1416  Weight: 208 lb 9.6 oz (94.6 kg)    Physical Exam Constitutional:      General: She is not in acute distress.    Appearance: She is not diaphoretic.  HENT:     Head: Normocephalic.  Eyes:     General: No scleral icterus. Cardiovascular:     Rate and Rhythm: Normal rate.  Pulmonary:     Effort: Pulmonary effort is normal. No respiratory distress.     Breath sounds: No wheezing.  Abdominal:     General: There is no distension.     Palpations: Abdomen is soft.  Musculoskeletal:        General: Normal range of motion.     Cervical back: Normal range of motion and neck supple.  Skin:     General: Skin is warm and dry.     Findings: No erythema.  Neurological:     Mental Status: She is alert and oriented to person, place, and time.     Cranial Nerves: No cranial nerve deficit.     Motor: No abnormal muscle tone.     Coordination: Coordination normal.  Psychiatric:        Mood and Affect: Mood and affect normal.   Patient is status post right breast lumpectomy, with healing scar, with focal tissue swelling.  No discharge.  LABORATORY DATA:  I have reviewed the data as listed    Latest Ref Rng & Units 12/23/2022   10:32 AM  CBC  WBC 4.0 - 10.5 K/uL 5.7   Hemoglobin 12.0 - 15.0 g/dL 13.6   Hematocrit 36.0 - 46.0 % 41.7   Platelets 150 - 400 K/uL 231       Latest Ref Rng & Units 12/23/2022   10:32 AM  CMP  Glucose 70 - 99 mg/dL 185   BUN 8 - 23 mg/dL 17   Creatinine 0.44 - 1.00 mg/dL 1.06   Sodium 135 - 145 mmol/L 137   Potassium 3.5 - 5.1 mmol/L 4.0   Chloride 98 - 111 mmol/L 100   CO2 22 - 32 mmol/L 27   Calcium 8.9 - 10.3 mg/dL 9.5   Total Protein 6.5 - 8.1 g/dL 7.4   Total Bilirubin 0.3 - 1.2 mg/dL 0.9   Alkaline Phos 38 - 126 U/L 56   AST 15 - 41 U/L 22   ALT 0 - 44  U/L 20      RADIOGRAPHIC STUDIES: I have personally reviewed the radiological images as listed and agreed with the findings in the report. MM Breast Surgical Specimen  Result Date: 02/05/2023 CLINICAL DATA:  Post lumpectomy specimen radiograph EXAM: SPECIMEN RADIOGRAPH OF THE RIGHT BREAST COMPARISON:  Previous exam(s). FINDINGS: Status post excision of the right breast. The radioactive seed and tag marker are present, completely intact, and were marked for pathology. IMPRESSION: Specimen radiograph of the right breast. Electronically Signed   By: Audie Pinto M.D.   On: 02/05/2023 11:21  NM Sentinel Node Inj-No Rpt (Breast)  Result Date: 02/05/2023 Sulfur Colloid was injected by the Nuclear Medicine Technologist for sentinel lymph node localization.   MM RT RADIO FREQUENCY TAG LOC  MAMMO GUIDE  Result Date: 02/02/2023 CLINICAL DATA:  For preoperative localization prior to right lumpectomy. Patient had prior Hologic tag placed with subsequent MRI and an additional site of carcinoma identified within the anterior right breast which will be localized today. EXAM: NEEDLE LOCALIZATION OF THE RIGHT BREAST WITH MAMMO GUIDANCE COMPARISON:  Previous exam(s). FINDINGS: Patient presents for needle localization prior to right lumpectomy. I met with the patient and we discussed the procedure of needle localization including benefits and alternatives. We discussed the high likelihood of a successful procedure. We discussed the risks of the procedure, including infection, bleeding, tissue injury, and further surgery. Informed, written consent was given. The usual time-out protocol was performed immediately prior to the procedure. Using mammographic guidance, sterile technique, 1% lidocaine and a 7 cm TAG needle, the cylinder clip was localized using a cranial approach. The images were marked for [place]. IMPRESSION: Tag localization of the right breast. No apparent complications. Electronically Signed   By: Lovey Newcomer M.D.   On: 02/02/2023 15:58

## 2023-03-02 NOTE — Assessment & Plan Note (Signed)
Surgical pathology results were reviewed with patient.  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  Positive posterior margin. Oncotype DX was sent on posterior breast mass, recurrence score is 12, chemo benefit less than 1%.  She does not need adjuvant chemotherapy.  Will obtain Oncotype DX on anterior breast mass We discussed about the standard of care of reexcision.  Given her age and other medical conditions, we discussed the alternative of omitting re-excision. Patient is interested in reexcision, she request to be sent to another surgeon for second opinion.  Refer to Dr. Marlou Starks.   We reviewed the plan of adjuvant radiation and adjuvant endocrine therapy.  Will refer to radiation oncology once per anterior breast mass Oncotype DX results.

## 2023-03-03 ENCOUNTER — Ambulatory Visit: Payer: Medicare Other | Admitting: Oncology

## 2023-03-04 ENCOUNTER — Telehealth: Payer: Self-pay

## 2023-03-04 NOTE — Telephone Encounter (Signed)
Contacted Dr. Ethlyn Gallery office ([facility]) and confirmed that they received referral. Rep states that pt will be contacted for appt once reviewed.

## 2023-03-19 ENCOUNTER — Encounter: Payer: Self-pay | Admitting: Oncology

## 2023-03-22 ENCOUNTER — Encounter: Payer: Self-pay | Admitting: *Deleted

## 2023-03-22 NOTE — Progress Notes (Signed)
Called Ms. Lemonds to give 2nd  specimen oncotype results as being low, no chemo needed.   She is waiting for her re-excision to be scheduled, she called Dr. Ethlyn Gallery office today and should hear from them soon.   I will get her an appt. To see Dr. Baruch Gouty 2 weeks after her re-excision.

## 2023-03-23 NOTE — Telephone Encounter (Signed)
Report scanned in media 

## 2023-03-31 ENCOUNTER — Ambulatory Visit: Payer: Self-pay | Admitting: General Surgery

## 2023-03-31 MED ORDER — KETOROLAC TROMETHAMINE 15 MG/ML IJ SOLN
15.0000 mg | Freq: Once | INTRAMUSCULAR | Status: AC
Start: 2023-03-31 — End: 2023-04-05

## 2023-04-01 ENCOUNTER — Encounter: Payer: Self-pay | Admitting: *Deleted

## 2023-04-01 DIAGNOSIS — C50919 Malignant neoplasm of unspecified site of unspecified female breast: Secondary | ICD-10-CM

## 2023-04-01 NOTE — Progress Notes (Signed)
Re-excision with Dr. Carolynne Edouard is scheduled for 4/18.  She will see Dr. Rushie Chestnut for consultation on 5/7.  Appt details given to her.

## 2023-04-02 ENCOUNTER — Encounter (HOSPITAL_BASED_OUTPATIENT_CLINIC_OR_DEPARTMENT_OTHER): Payer: Self-pay | Admitting: General Surgery

## 2023-04-02 ENCOUNTER — Other Ambulatory Visit: Payer: Self-pay

## 2023-04-05 ENCOUNTER — Encounter (HOSPITAL_BASED_OUTPATIENT_CLINIC_OR_DEPARTMENT_OTHER)
Admission: RE | Admit: 2023-04-05 | Discharge: 2023-04-05 | Disposition: A | Discharge status: Home / Self Care | Payer: Medicare Other | Source: Ambulatory Visit | Attending: General Surgery | Admitting: General Surgery

## 2023-04-05 DIAGNOSIS — Z01812 Encounter for preprocedural laboratory examination: Secondary | ICD-10-CM | POA: Diagnosis present

## 2023-04-05 DIAGNOSIS — E119 Type 2 diabetes mellitus without complications: Secondary | ICD-10-CM | POA: Diagnosis not present

## 2023-04-05 LAB — BASIC METABOLIC PANEL
Anion gap: 8 (ref 5–15)
BUN: 17 mg/dL (ref 8–23)
CO2: 25 mmol/L (ref 22–32)
Calcium: 9.2 mg/dL (ref 8.9–10.3)
Chloride: 103 mmol/L (ref 98–111)
Creatinine, Ser: 1.2 mg/dL — ABNORMAL HIGH (ref 0.44–1.00)
GFR, Estimated: 46 mL/min — ABNORMAL LOW (ref >60)
Glucose, Bld: 159 mg/dL — ABNORMAL HIGH (ref 70–99)
Potassium: 5 mmol/L (ref 3.5–5.1)
Sodium: 136 mmol/L (ref 135–145)

## 2023-04-05 MED ORDER — CHLORHEXIDINE GLUCONATE CLOTH 2 % EX PADS: 6.0000 | MEDICATED_PAD | Freq: Once | CUTANEOUS | Status: DC

## 2023-04-05 NOTE — Progress Notes (Signed)

## 2023-04-08 ENCOUNTER — Ambulatory Visit (HOSPITAL_BASED_OUTPATIENT_CLINIC_OR_DEPARTMENT_OTHER): Payer: Medicare Other | Admitting: Certified Registered"

## 2023-04-08 ENCOUNTER — Ambulatory Visit (HOSPITAL_BASED_OUTPATIENT_CLINIC_OR_DEPARTMENT_OTHER)
Admission: RE | Admit: 2023-04-08 | Discharge: 2023-04-08 | Disposition: A | Discharge status: Home / Self Care | Payer: Medicare Other | Attending: General Surgery | Admitting: General Surgery

## 2023-04-08 ENCOUNTER — Other Ambulatory Visit: Payer: Self-pay

## 2023-04-08 ENCOUNTER — Encounter (HOSPITAL_BASED_OUTPATIENT_CLINIC_OR_DEPARTMENT_OTHER)
Admission: RE | Disposition: A | Discharge status: Home / Self Care | Payer: Self-pay | Source: Home / Self Care | Attending: General Surgery

## 2023-04-08 ENCOUNTER — Encounter (HOSPITAL_BASED_OUTPATIENT_CLINIC_OR_DEPARTMENT_OTHER): Payer: Self-pay | Admitting: General Surgery

## 2023-04-08 DIAGNOSIS — Z170 Estrogen receptor positive status [ER+]: Secondary | ICD-10-CM | POA: Insufficient documentation

## 2023-04-08 DIAGNOSIS — C50211 Malignant neoplasm of upper-inner quadrant of right female breast: Secondary | ICD-10-CM | POA: Diagnosis present

## 2023-04-08 DIAGNOSIS — E1122 Type 2 diabetes mellitus with diabetic chronic kidney disease: Secondary | ICD-10-CM | POA: Insufficient documentation

## 2023-04-08 DIAGNOSIS — I129 Hypertensive chronic kidney disease with stage 1 through stage 4 chronic kidney disease, or unspecified chronic kidney disease: Secondary | ICD-10-CM

## 2023-04-08 DIAGNOSIS — E119 Type 2 diabetes mellitus without complications: Secondary | ICD-10-CM

## 2023-04-08 DIAGNOSIS — N189 Chronic kidney disease, unspecified: Secondary | ICD-10-CM

## 2023-04-08 DIAGNOSIS — N1832 Chronic kidney disease, stage 3b: Secondary | ICD-10-CM | POA: Insufficient documentation

## 2023-04-08 DIAGNOSIS — C50911 Malignant neoplasm of unspecified site of right female breast: Secondary | ICD-10-CM

## 2023-04-08 HISTORY — PX: RE-EXCISION OF BREAST CANCER,SUPERIOR MARGINS: SHX6047

## 2023-04-08 LAB — NO BLOOD PRODUCTS

## 2023-04-08 LAB — GLUCOSE, CAPILLARY
Glucose-Capillary: 168 mg/dL — ABNORMAL HIGH (ref 70–99)
Glucose-Capillary: 174 mg/dL — ABNORMAL HIGH (ref 70–99)

## 2023-04-08 SURGERY — RE-EXCISION OF BREAST CANCER,SUPERIOR MARGINS
Anesthesia: General | Site: Breast | Laterality: Right

## 2023-04-08 MED ORDER — FENTANYL CITRATE (PF) 100 MCG/2ML IJ SOLN
INTRAMUSCULAR | Status: AC
  Filled 2023-04-08: qty 2

## 2023-04-08 MED ORDER — BUPIVACAINE HCL (PF) 0.25 % IJ SOLN
INTRAMUSCULAR | Status: DC | PRN
  Administered 2023-04-08: 30 mL

## 2023-04-08 MED ORDER — CEFAZOLIN SODIUM-DEXTROSE 2-3 GM-%(50ML) IV SOLR
INTRAVENOUS | Status: DC | PRN
  Administered 2023-04-08: 2 g via INTRAVENOUS

## 2023-04-08 MED ORDER — OXYCODONE HCL 5 MG PO TABS
ORAL_TABLET | ORAL | Status: AC
  Filled 2023-04-08: qty 1

## 2023-04-08 MED ORDER — FENTANYL CITRATE (PF) 100 MCG/2ML IJ SOLN
25.0000 ug | INTRAMUSCULAR | Status: DC | PRN
  Administered 2023-04-08: 25 ug via INTRAVENOUS

## 2023-04-08 MED ORDER — LACTATED RINGERS IV SOLN: INTRAVENOUS | Status: DC

## 2023-04-08 MED ORDER — OXYCODONE HCL 5 MG PO TABS
5.0000 mg | ORAL_TABLET | Freq: Four times a day (QID) | ORAL | 0 refills | Status: AC | PRN
Start: 2023-04-08 — End: ?

## 2023-04-08 MED ORDER — FENTANYL CITRATE (PF) 100 MCG/2ML IJ SOLN
INTRAMUSCULAR | Status: DC | PRN
  Administered 2023-04-08 (×2): 25 ug via INTRAVENOUS

## 2023-04-08 MED ORDER — PROPOFOL 10 MG/ML IV BOLUS
INTRAVENOUS | Status: AC
  Filled 2023-04-08: qty 20

## 2023-04-08 MED ORDER — EPHEDRINE SULFATE (PRESSORS) 50 MG/ML IJ SOLN
INTRAMUSCULAR | Status: DC | PRN
  Administered 2023-04-08 (×2): 10 mg via INTRAVENOUS
  Administered 2023-04-08: 5 mg via INTRAVENOUS

## 2023-04-08 MED ORDER — LACTATED RINGERS IV SOLN: INTRAVENOUS | Status: DC | PRN

## 2023-04-08 MED ORDER — ONDANSETRON HCL 4 MG/2ML IJ SOLN
INTRAMUSCULAR | Status: DC | PRN
  Administered 2023-04-08: 4 mg via INTRAVENOUS

## 2023-04-08 MED ORDER — LIDOCAINE HCL (CARDIAC) PF 50 MG/5ML IV SOSY
PREFILLED_SYRINGE | INTRAVENOUS | Status: DC | PRN
  Administered 2023-04-08: 60 mg via INTRAVENOUS

## 2023-04-08 MED ORDER — ONDANSETRON HCL 4 MG/2ML IJ SOLN
INTRAMUSCULAR | Status: AC
  Filled 2023-04-08: qty 2

## 2023-04-08 MED ORDER — CEFAZOLIN SODIUM-DEXTROSE 2-4 GM/100ML-% IV SOLN: 2.0000 g | INTRAVENOUS | Status: DC

## 2023-04-08 MED ORDER — ACETAMINOPHEN 500 MG PO TABS
1000.0000 mg | ORAL_TABLET | ORAL | Status: AC
  Administered 2023-04-08: 1000 mg via ORAL

## 2023-04-08 MED ORDER — LIDOCAINE 2% (20 MG/ML) 5 ML SYRINGE
INTRAMUSCULAR | Status: AC
  Filled 2023-04-08: qty 5

## 2023-04-08 MED ORDER — ACETAMINOPHEN 500 MG PO TABS
ORAL_TABLET | ORAL | Status: AC
  Filled 2023-04-08: qty 2

## 2023-04-08 MED ORDER — BUPIVACAINE HCL (PF) 0.25 % IJ SOLN
INTRAMUSCULAR | Status: AC
  Filled 2023-04-08: qty 30

## 2023-04-08 MED ORDER — PROPOFOL 500 MG/50ML IV EMUL
INTRAVENOUS | Status: DC | PRN
  Administered 2023-04-08: 35 ug/kg/min via INTRAVENOUS

## 2023-04-08 MED ORDER — DEXAMETHASONE SODIUM PHOSPHATE 10 MG/ML IJ SOLN
INTRAMUSCULAR | Status: AC
  Filled 2023-04-08: qty 1

## 2023-04-08 MED ORDER — CEFAZOLIN SODIUM-DEXTROSE 2-4 GM/100ML-% IV SOLN
INTRAVENOUS | Status: AC
  Filled 2023-04-08: qty 100

## 2023-04-08 MED ORDER — DEXAMETHASONE SODIUM PHOSPHATE 10 MG/ML IJ SOLN
INTRAMUSCULAR | Status: DC | PRN
  Administered 2023-04-08: 5 mg via INTRAVENOUS

## 2023-04-08 MED ORDER — PROPOFOL 10 MG/ML IV BOLUS
INTRAVENOUS | Status: DC | PRN
  Administered 2023-04-08: 140 mg via INTRAVENOUS
  Administered 2023-04-08: 20 mg via INTRAVENOUS

## 2023-04-08 MED ORDER — GABAPENTIN 100 MG PO CAPS
ORAL_CAPSULE | ORAL | Status: AC
  Filled 2023-04-08: qty 1

## 2023-04-08 MED ORDER — OXYCODONE HCL 5 MG PO TABS
5.0000 mg | ORAL_TABLET | ORAL | Status: AC | PRN
  Administered 2023-04-08: 5 mg via ORAL

## 2023-04-08 MED ORDER — GABAPENTIN 100 MG PO CAPS
100.0000 mg | ORAL_CAPSULE | ORAL | Status: AC
  Administered 2023-04-08: 100 mg via ORAL

## 2023-04-08 SURGICAL SUPPLY — 38 items
ADH SKN CLS APL DERMABOND .7 (GAUZE/BANDAGES/DRESSINGS) ×1
APL PRP STRL LF DISP 70% ISPRP (MISCELLANEOUS) ×1
APPLIER CLIP 9.375 MED OPEN (MISCELLANEOUS)
APR CLP MED 9.3 20 MLT OPN (MISCELLANEOUS)
BLADE SURG 15 STRL LF DISP TIS (BLADE) ×1 IMPLANT
BLADE SURG 15 STRL SS (BLADE) ×1
CANISTER SUCT 1200ML W/VALVE (MISCELLANEOUS) ×1 IMPLANT
CHLORAPREP W/TINT 26 (MISCELLANEOUS) ×1 IMPLANT
CLIP APPLIE 9.375 MED OPEN (MISCELLANEOUS) IMPLANT
COVER BACK TABLE 60X90IN (DRAPES) ×1 IMPLANT
COVER MAYO STAND STRL (DRAPES) ×1 IMPLANT
DERMABOND ADVANCED .7 DNX12 (GAUZE/BANDAGES/DRESSINGS) ×1 IMPLANT
DRAPE LAPAROSCOPIC ABDOMINAL (DRAPES) ×1 IMPLANT
DRAPE UTILITY XL STRL (DRAPES) ×1 IMPLANT
ELECT COATED BLADE 2.86 ST (ELECTRODE) ×1 IMPLANT
ELECT REM PT RETURN 9FT ADLT (ELECTROSURGICAL) ×1
ELECTRODE REM PT RTRN 9FT ADLT (ELECTROSURGICAL) ×1 IMPLANT
GLOVE BIO SURGEON STRL SZ7.5 (GLOVE) ×1 IMPLANT
GOWN STRL REUS W/ TWL LRG LVL3 (GOWN DISPOSABLE) ×2 IMPLANT
GOWN STRL REUS W/TWL LRG LVL3 (GOWN DISPOSABLE) ×2
KIT MARKER MARGIN INK (KITS) IMPLANT
NDL HYPO 25X1 1.5 SAFETY (NEEDLE) ×1 IMPLANT
NEEDLE HYPO 25X1 1.5 SAFETY (NEEDLE) ×1 IMPLANT
NS IRRIG 1000ML POUR BTL (IV SOLUTION) ×1 IMPLANT
PACK BASIN DAY SURGERY FS (CUSTOM PROCEDURE TRAY) ×1 IMPLANT
PENCIL SMOKE EVACUATOR (MISCELLANEOUS) ×1 IMPLANT
SLEEVE SCD COMPRESS KNEE MED (STOCKING) ×1 IMPLANT
SPIKE FLUID TRANSFER (MISCELLANEOUS) IMPLANT
SPONGE T-LAP 18X18 ~~LOC~~+RFID (SPONGE) ×1 IMPLANT
STAPLER VISISTAT 35W (STAPLE) IMPLANT
SUT MON AB 4-0 PC3 18 (SUTURE) ×1 IMPLANT
SUT SILK 2 0 SH (SUTURE) IMPLANT
SUT VICRYL 3-0 CR8 SH (SUTURE) ×1 IMPLANT
SYR CONTROL 10ML LL (SYRINGE) ×1 IMPLANT
TOWEL GREEN STERILE FF (TOWEL DISPOSABLE) ×1 IMPLANT
TRAY FAXITRON CT DISP (TRAY / TRAY PROCEDURE) IMPLANT
TUBE CONNECTING 20X1/4 (TUBING) ×1 IMPLANT
YANKAUER SUCT BULB TIP NO VENT (SUCTIONS) ×1 IMPLANT

## 2023-04-08 NOTE — H&P (Signed)
REFERRING PHYSICIAN: Rickard Patience, MD PROVIDER: Lindell Noe, MD MRN: [id number] DOB: 10-Nov-1946 Subjective   Chief Complaint: New Consultation (Invasive Carcinoma of Breast)  History of Present Illness: Emma Torres is a 77 y.o. female who is seen today as an office consultation for evaluation of New Consultation (Invasive Carcinoma of Breast)  We are asked to see the patient in consultation by Dr. Cathie Hoops to evaluate her for a right breast cancer. The patient is a 77 year old black female who recently underwent 2 lumpectomies of the right breast with sentinel node biopsy. There were 2 cancers in the right breast that were both excised. The lymph node was normal. The cancers were ER/PR positive and HER2 negative. The Oncotype was low risk. Unfortunately one of the 2 cancers had a positive inferior margin. It is difficult to tell from the pathology report which incision had the margin that was positive. The patient comes today for a second opinion. She is otherwise in good health except for diabetes and hypertension. She does not smoke.  Review of Systems: A complete review of systems was obtained from the patient. I have reviewed this information and discussed as appropriate with the patient. See HPI as well for other ROS.  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   Patient Active Problem List  Diagnosis  Essential hypertension  Mixed hyperlipidemia  Primary osteoarthritis of left knee  Primary osteoarthritis of right shoulder  CKD (chronic kidney disease) stage 3, GFR 30-59 ml/min (CMS-HCC)  Hx of adenomatous colonic polyps  Postmenopausal  Type 2 diabetes, HbA1c goal < 7% (CMS-HCC)  Type 2 diabetes mellitus with stage 3b chronic kidney disease, without long-term  current use of insulin (CMS-HCC)  Primary osteoarthritis of right knee  Malignant neoplasm of upper-inner quadrant of right breast in female, estrogen receptor positive   Past Surgical History:  Procedure Laterality Date  COLONOSCOPY 11/07/2004  Adenomatous Polyp; [facility] (Father/Mother/Brother)  CATARACT EXTRACTION W/ INTRAOCULAR LENS IMPLANT Left 10/2017  CATARACT EXTRACTION W/ INTRAOCULAR LENS IMPLANT Right 12/2017  COLONOSCOPY 08/29/2018  PH Adenomatous Polyp: [facility] 08/2023  MASTECTOMY PARTIAL 02/05/2023  Dr. Sung Amabile --- w/ RF & SN  COLONOSCOPY 07/18/2008, 09/22/2013  PH Adenomatous Polyp: [facility] (Father/Mother/ Brother): [facility] 09/2018: Recall ltr mailed 08/04/18 (kj)  HYSTERECTOMY  Right shoulder dislocation  TONSILLECTOMY  TUBAL LIGATION    Allergies  Allergen Reactions  Adhesive Rash   Current Outpatient Medications on File Prior to Visit  Medication Sig Dispense Refill  acetaminophen (TYLENOL) 650 MG ER tablet Take 650 mg by mouth as needed for Pain  aspirin 81 MG EC tablet Take 81 mg by mouth once daily  calcium carbonate-vitamin D3 600 mg-20 mcg (800 unit) Tab Take 1 tablet by mouth once daily  cyanocobalamin (VITAMIN B12) 1000 MCG tablet Take 1,000 mcg by mouth once daily  glipiZIDE (GLUCOTROL) 10 MG tablet TAKE 2 TABLETS (20 MG TOTAL) BY MOUTH 2 (TWO) TIMES DAILY BEFORE MEALS 360 tablet 1  Herbal Supplement Take 1 tablet by mouth once daily. Herbal Name: Magnesium with Chelated Zinc.  JANUVIA 100 mg tablet Take 1 tablet (100 mg total) by mouth once daily 30 tablet 0  lisinopriL-hydroCHLOROthiazide (ZESTORETIC) 20-12.5 mg tablet TAKE 1 TABLET BY MOUTH EVERY DAY 90 tablet 1  metFORMIN (GLUCOPHAGE) 1000 MG tablet TAKE 1 TABLET EVERY  MORNING AND 1&1/2 TABLET AT SUPPER 225 tablet 1  metoprolol succinate (TOPROL-XL) 50 MG XL tablet TAKE 1 TABLET BY MOUTH EVERY DAY 90 tablet 1  multivitamin capsule Take 1 capsule by mouth once daily.  ONETOUCH ULTRA BLUE TEST STRIP test strip Use  once daily E11.9 100 strip 1  ONETOUCH ULTRA2 METER kit Use as instructed. 1 each 0  pravastatin (PRAVACHOL) 40 MG tablet TAKE 1 TABLET BY MOUTH EVERY DAY 90 tablet 1  lancing device with lancets kit Use 1 each once daily Use as instructed. One Touch Ultra lancets E11.9 100 each 3   No current facility-administered medications on file prior to visit.   Family History  Problem Relation Age of Onset  Prostate cancer Father  Diabetes type II Father  High blood pressure (Hypertension) Father  Colon polyps Father  High blood pressure (Hypertension) Mother  Heart failure Mother  Colon polyps Mother  Breast cancer Other  Diabetes Other  High blood pressure (Hypertension) Other  Uterine cancer Other  Stroke Other  Colon polyps Brother    Social History   Tobacco Use  Smoking Status Never  Smokeless Tobacco Never    Social History   Socioeconomic History  Marital status: Married  Number of children: 2  Years of education: 16  Occupational History  Occupation: Retired  Tobacco Use  Smoking status: Never  Smokeless tobacco: Never  Advertising account planner Use: Never used  Substance and Sexual Activity  Alcohol use: No  Drug use: No  Sexual activity: Defer   Objective:   Vitals:  BP: (!) 164/78  Pulse: 64  Temp: 36.6 C (97.8 F)  SpO2: 98%  Weight: 93.8 kg (206 lb 12.8 oz)  Height: 162.6 cm (5\' 4" )  PainSc: 2  PainLoc: Breast   Body mass index is 35.5 kg/m.  Physical Exam Vitals reviewed.  Constitutional:  General: She is not in acute distress. Appearance: Normal appearance.  HENT:  Head: Normocephalic and atraumatic.  Right Ear: External ear normal.  Left Ear: External ear normal.  Nose: Nose normal.  Mouth/Throat:  Mouth: Mucous membranes are moist.  Pharynx: Oropharynx is clear.  Eyes:  General: No scleral icterus. Extraocular Movements: Extraocular movements intact.  Conjunctiva/sclera: Conjunctivae normal.  Pupils: Pupils are equal, round, and  reactive to light.  Cardiovascular:  Rate and Rhythm: Normal rate and regular rhythm.  Pulses: Normal pulses.  Heart sounds: Normal heart sounds.  Pulmonary:  Effort: Pulmonary effort is normal. No respiratory distress.  Breath sounds: Normal breath sounds.  Abdominal:  General: Bowel sounds are normal.  Palpations: Abdomen is soft.  Tenderness: There is no abdominal tenderness.  Musculoskeletal:  General: No swelling, tenderness or deformity. Normal range of motion.  Cervical back: Normal range of motion and neck supple.  Skin: General: Skin is warm and dry.  Coloration: Skin is not jaundiced.  Neurological:  General: No focal deficit present.  Mental Status: She is alert and oriented to person, place, and time.  Psychiatric:  Mood and Affect: Mood normal.  Behavior: Behavior normal.     Breast: The right breast has a superior and inferior incision that is healing well with no sign of infection or seroma. The axillary incision also is healing well. There is no palpable mass in the left breast. There is no palpable axillary, supraclavicular, or cervical lymphadenopathy.  Labs, Imaging and Diagnostic Testing:  Assessment and Plan:   Diagnoses and all orders for this visit:  Malignant neoplasm of upper-inner quadrant of right breast in  female, estrogen receptor positive    The patient is about 1 month status post right breast lumpectomy for breast cancer. Unfortunately one of the lumpectomies had a positive inferior margin. This needs to be excised. I have discussed with her in detail the risks and benefits of the operation as well as some of the technical aspects including the risk of still having a positive margin after reexcision and she understands and wishes to proceed. Unfortunately I cannot tell from the report which incision has the positive inferior margin. We will try to reach out to the original surgeon to try to get that information. If we cannot get that information  then I strongly recommend that she return to her original surgeon to make sure that the cancer is removed appropriately. I will call her with the information that I gather and then we can proceed accordingly

## 2023-04-08 NOTE — Op Note (Signed)
04/08/2023  9:13 AM  PATIENT:  Emma Torres  77 y.o. female  PRE-OPERATIVE DIAGNOSIS:  RIGHT BREAST CANCER  POST-OPERATIVE DIAGNOSIS:  RIGHT BREAST CANCER  PROCEDURE:  Procedure(s): RE-EXCISION OF RIGHT BREAST MARGINS X2 (Right)  SURGEON:  Surgeon(s) and Role:    * Griselda Miner, MD - Primary  PHYSICIAN ASSISTANT:   ASSISTANTS: none   ANESTHESIA:   local and general  EBL:  15 mL   BLOOD ADMINISTERED:none  DRAINS: none   LOCAL MEDICATIONS USED:  MARCAINE     SPECIMEN:  Source of Specimen:  inferior margins from superior and inferior lumpectomy cavities  DISPOSITION OF SPECIMEN:  PATHOLOGY  COUNTS:  YES  TOURNIQUET:  * No tourniquets in log *  DICTATION: .Dragon Dictation  After informed consent was obtained the patient was brought to the operating room and placed in the supine position on the operating table.  After adequate induction of general anesthesia the patient's right breast was prepped with ChloraPrep, allowed to dry, and draped in usual sterile manner.  The patient had 2 lumpectomies from the right breast at another institution.  One of the 2 lumpectomy cavities had a positive inferior margin.  It was not possible for me to tell which of the 2 lumpectomy cavities had the positive margin.  Because of this she has asked [place] to reexcised the inferior margin from both lumpectomy cavities.  The area around each incision on the right breast was infiltrated with quarter percent Marcaine.  The superior incision was opened first.  The incision was carried through the skin and subcutaneous tissue sharply with the electrocautery until the lumpectomy cavity was identified.  The inferior edge of the lumpectomy cavity was removed sharply with the electrocautery.  It came out in 3 pieces.  It was marked as the inferior anterior edge, inferior middle edge and inferior deep or posterior edge of the superior lumpectomy cavity.  The tissue was marked with the appropriate paint color  on the new true surgical margin.  All of this tissue was sent to pathology for further evaluation.  Hemostasis was achieved using the Bovie electrocautery.  The wound was infiltrated with more quarter percent Marcaine and irrigated with saline.  The deep layer of the incision was closed with interrupted 3-0 Vicryl stitches.  The skin was then closed with a running 4-0 Monocryl subcuticular stitch.  Attention was then turned to the inferior incision.  The area around this was also infiltrated with quarter percent Marcaine.  The incision was opened with a 15 blade knife.  The incision was carried through the skin and subcutaneous tissue sharply with the electrocautery until the previous lumpectomy cavity was identified.  Again the inferior margin of this cavity was excised sharply with the electrocautery.  The margin was marked with the appropriate paint color.  The tissue was sent to pathology for further evaluation.  Hemostasis was achieved using the Bovie electrocautery.  The wound was irrigated with saline and infiltrated with more quarter percent Marcaine.  The deep layer of the incision was then closed with interrupted 3-0 Vicryl stitches.  The skin was closed with a running 4-0 Monocryl subcuticular stitch.  Dermabond dressings were then applied to both incisions.  The patient tolerated the procedure well.  At the end of the case all needle sponge and instrument counts were correct.  The patient was then awakened and taken to recovery in stable condition.  PLAN OF CARE: Discharge to home after PACU  PATIENT DISPOSITION:  PACU -  hemodynamically stable.   Delay start of Pharmacological VTE agent (>24hrs) due to surgical blood loss or risk of bleeding: not applicable

## 2023-04-08 NOTE — Anesthesia Preprocedure Evaluation (Addendum)
Anesthesia Evaluation  Patient identified by MRN, date of birth, ID band Patient awake    Reviewed: Allergy & Precautions, NPO status , Patient's Chart, lab work & pertinent test results  History of Anesthesia Complications Negative for: history of anesthetic complications  Airway Mallampati: III  TM Distance: >3 FB Neck ROM: Full    Dental  (+) Dental Advisory Given,    Pulmonary neg pulmonary ROS   breath sounds clear to auscultation       Cardiovascular hypertension, Pt. on medications (-) angina (-) Past MI and (-) CHF  Rhythm:Regular     Neuro/Psych negative neurological ROS  negative psych ROS   GI/Hepatic negative GI ROS, Neg liver ROS,,,  Endo/Other  diabetes    Renal/GU CRFRenal diseaseLab Results      Component                Value               Date                      CREATININE               1.20 (H)            04/05/2023                Musculoskeletal  (+) Arthritis ,    Abdominal   Peds  Hematology negative hematology ROS (+) Lab Results      Component                Value               Date                      WBC                      5.7                 12/23/2022                HGB                      13.6                12/23/2022                HCT                      41.7                12/23/2022                MCV                      83.4                12/23/2022                PLT                      231                 12/23/2022              Anesthesia Other Findings   Reproductive/Obstetrics  Anesthesia Physical Anesthesia Plan  ASA: 3  Anesthesia Plan: General   Post-op Pain Management: Tylenol PO (pre-op)* and Gabapentin PO (pre-op)*   Induction: Intravenous  PONV Risk Score and Plan: 3 and Ondansetron, Dexamethasone, Propofol infusion and TIVA  Airway Management Planned: LMA  Additional Equipment:  None  Intra-op Plan:   Post-operative Plan: Extubation in OR  Informed Consent: I have reviewed the patients History and Physical, chart, labs and discussed the procedure including the risks, benefits and alternatives for the proposed anesthesia with the patient or authorized representative who has indicated his/her understanding and acceptance.     Dental advisory given  Plan Discussed with: CRNA  Anesthesia Plan Comments:         Anesthesia Quick Evaluation

## 2023-04-08 NOTE — Interval H&P Note (Signed)
History and Physical Interval Note:  04/08/2023 7:42 AM  Emma Torres  has presented today for surgery, with the diagnosis of RIGHT BREAST CANCER.  The various methods of treatment have been discussed with the patient and family. After consideration of risks, benefits and other options for treatment, the patient has consented to  Procedure(s): RE-EXCISION OF RIGHT BREAST MARGINS X2 (Right) as a surgical intervention.  The patient's history has been reviewed, patient examined, no change in status, stable for surgery.  I have reviewed the patient's chart and labs.  Questions were answered to the patient's satisfaction.     Chevis Pretty III

## 2023-04-08 NOTE — Transfer of Care (Signed)
Immediate Anesthesia Transfer of Care Note  Patient: Emma Torres  Procedure(s) Performed: RE-EXCISION OF RIGHT BREAST MARGINS X2 (Right: Breast)  Patient Location: awake, alert , and oriented  Anesthesia Type:General  Level of Consciousness: awake, alert , and oriented  Airway & Oxygen Therapy: Patient Spontanous Breathing and Patient connected to face mask oxygen  Post-op Assessment: Report given to RN and Post -op Vital signs reviewed and stable  Post vital signs: Reviewed and stable  Last Vitals:  Vitals Value Taken Time  BP 151/82 04/08/23 1028  Temp 36.4 C 04/08/23 1028  Pulse 60 04/08/23 1028  Resp 18 04/08/23 1028  SpO2 95 % 04/08/23 1028    Last Pain:  Vitals:   04/08/23 1028  TempSrc: Temporal  PainSc: 7       Patients Stated Pain Goal: 3 (04/08/23 0645)  Complications: No notable events documented.

## 2023-04-08 NOTE — Discharge Instructions (Addendum)
  Post Anesthesia Home Care Instructions  Activity: Get plenty of rest for the remainder of the day. A responsible individual must stay with you for 24 hours following the procedure.  For the next 24 hours, DO NOT: -Drive a car -Advertising copywriter -Drink alcoholic beverages -Take any medication unless instructed by your physician -Make any legal decisions or sign important papers.  Meals: Start with liquid foods such as gelatin or soup. Progress to regular foods as tolerated. Avoid greasy, spicy, heavy foods. If nausea and/or vomiting occur, drink only clear liquids until the nausea and/or vomiting subsides. Call your physician if vomiting continues.  Special Instructions/Symptoms: Your throat may feel dry or sore from the anesthesia or the breathing tube placed in your throat during surgery. If this causes discomfort, gargle with warm salt water. The discomfort should disappear within 24 hours.  If you had a scopolamine patch placed behind your ear for the management of post- operative nausea and/or vomiting:  1. The medication in the patch is effective for 72 hours, after which it should be removed.  Wrap patch in a tissue and discard in the trash. Wash hands thoroughly with soap and water. 2. You may remove the patch earlier than 72 hours if you experience unpleasant side effects which may include dry mouth, dizziness or visual disturbances. 3. Avoid touching the patch. Wash your hands with soap and water after contact with the patch.     Next dose of Tylenol may be given at 1:00pm if needed.

## 2023-04-09 ENCOUNTER — Encounter (HOSPITAL_BASED_OUTPATIENT_CLINIC_OR_DEPARTMENT_OTHER): Payer: Self-pay | Admitting: General Surgery

## 2023-04-12 LAB — SURGICAL PATHOLOGY

## 2023-04-12 NOTE — Anesthesia Postprocedure Evaluation (Signed)
Anesthesia Post Note  Patient: Emma Torres  Procedure(s) Performed: RE-EXCISION OF RIGHT BREAST MARGINS X2 (Right: Breast)     Patient location during evaluation: PACU Anesthesia Type: General Level of consciousness: awake and alert Pain management: pain level controlled Vital Signs Assessment: post-procedure vital signs reviewed and stable Respiratory status: spontaneous breathing, nonlabored ventilation and respiratory function stable Cardiovascular status: blood pressure returned to baseline and stable Postop Assessment: no apparent nausea or vomiting Anesthetic complications: no   No notable events documented.  Last Vitals:  Vitals:   04/08/23 1015 04/08/23 1028  BP: (!) 157/78 (!) 151/82  Pulse: 62 60  Resp: 16 18  Temp:  (!) 36.4 C  SpO2: 92% 95%    Last Pain:  Vitals:   04/09/23 0947  TempSrc:   PainSc: 0-No pain                 Jhalil Silvera

## 2023-04-27 ENCOUNTER — Encounter: Payer: Self-pay | Admitting: Radiation Oncology

## 2023-04-27 ENCOUNTER — Ambulatory Visit
Admission: RE | Admit: 2023-04-27 | Discharge: 2023-04-27 | Disposition: A | Discharge status: Home / Self Care | Payer: Medicare Other | Source: Ambulatory Visit | Attending: Radiation Oncology | Admitting: Radiation Oncology

## 2023-04-27 VITALS — BP 139/70 | HR 67 | Temp 97.5°F | Resp 16 | Ht 64.0 in | Wt 203.3 lb

## 2023-04-27 DIAGNOSIS — N1832 Chronic kidney disease, stage 3b: Secondary | ICD-10-CM | POA: Insufficient documentation

## 2023-04-27 DIAGNOSIS — E7800 Pure hypercholesterolemia, unspecified: Secondary | ICD-10-CM | POA: Diagnosis not present

## 2023-04-27 DIAGNOSIS — Z803 Family history of malignant neoplasm of breast: Secondary | ICD-10-CM | POA: Diagnosis not present

## 2023-04-27 DIAGNOSIS — Z87891 Personal history of nicotine dependence: Secondary | ICD-10-CM | POA: Diagnosis not present

## 2023-04-27 DIAGNOSIS — C50411 Malignant neoplasm of upper-outer quadrant of right female breast: Secondary | ICD-10-CM | POA: Diagnosis present

## 2023-04-27 DIAGNOSIS — Z7984 Long term (current) use of oral hypoglycemic drugs: Secondary | ICD-10-CM | POA: Insufficient documentation

## 2023-04-27 DIAGNOSIS — Z8042 Family history of malignant neoplasm of prostate: Secondary | ICD-10-CM | POA: Insufficient documentation

## 2023-04-27 DIAGNOSIS — I129 Hypertensive chronic kidney disease with stage 1 through stage 4 chronic kidney disease, or unspecified chronic kidney disease: Secondary | ICD-10-CM | POA: Diagnosis not present

## 2023-04-27 DIAGNOSIS — Z510 Encounter for antineoplastic radiation therapy: Secondary | ICD-10-CM | POA: Diagnosis not present

## 2023-04-27 DIAGNOSIS — Z79899 Other long term (current) drug therapy: Secondary | ICD-10-CM | POA: Diagnosis not present

## 2023-04-27 DIAGNOSIS — E1122 Type 2 diabetes mellitus with diabetic chronic kidney disease: Secondary | ICD-10-CM | POA: Diagnosis not present

## 2023-04-27 DIAGNOSIS — C50919 Malignant neoplasm of unspecified site of unspecified female breast: Secondary | ICD-10-CM

## 2023-04-27 DIAGNOSIS — Z170 Estrogen receptor positive status [ER+]: Secondary | ICD-10-CM | POA: Insufficient documentation

## 2023-04-27 NOTE — Consult Note (Signed)
NEW PATIENT EVALUATION  Name: Emma Torres  MRN: [id number]  Date:   04/27/2023     DOB: 1946-09-24   This 77 y.o. female patient presents to the clinic for initial evaluation of pathologic stage T2 N0 M0.  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   PREVIOUS INVESTIGATIONS:  Mammogram MRI scans ultrasound reviewed Pathology report reviewed Clinical notes reviewed  HPI: Patient is a 77 year old female who presented with abnormal mammogram of her bilateral breasts back in November 23.  In the right breast there was architectural distortion as well as a 14 mm mass in the left breast at the 3 o'clock position 2 cm from the nipple.  MRI scan of her breasts bilaterally demonstrated indeterminate mass in the upper inner quadrant of the right breast and an indeterminate mass in the lower outer quadrant of the left breast far posteriorly.  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  Right breast showed invasive mammary carcinoma with lobular features.  She underwent a wide local excision of the right breast showing at 6.2 mm area of overall grade 2 invasive ductal carcinoma with lobular features.  Tumor was multifocal.  Margins were clear but close to 1.6 mm and she underwent reexcision showing no residual disease.  Tumor was ER/PR positive HER2/neu not overexpressed.  Oncotype Dx was performed showing less than 1% benefit from chemotherapy.  She is seen today for radiation oncology evaluation she is doing well she specifically denies breast tenderness cough or bone pain.  PLANNED TREATMENT REGIMEN: Hypofractionated right whole breast radiation  PAST MEDICAL HISTORY:  has a past medical history of Arthritis, Breast  cancer, right breast (HCC) (11/2022), Chronic kidney disease, Colon polyps, Controlled type 2 diabetes mellitus with stage 3 chronic kidney disease (HCC), Cough, Diverticulosis, Hypercholesterolemia, Hypertension, IBS (irritable bowel syndrome), and Stage 3b chronic kidney disease (CKD) (HCC).    PAST SURGICAL HISTORY:  Past Surgical History:  Procedure Laterality Date   ABDOMINAL HYSTERECTOMY     BREAST BIOPSY Right 11/30/2022   MM RT BREAST BX W LOC DEV 1ST LESION IMAGE BX SPEC STEREO GUIDE 11/30/2022 [facility]   BREAST BIOPSY Right 12/24/2022   MM RT RADIO FREQUENCY TAG LOC MAMMO GUIDE 12/24/2022 [facility]   BREAST BIOPSY Right 02/02/2023   MM RT RADIO FREQUENCY TAG LOC MAMMO GUIDE 02/02/2023 [facility]   breast biospy Right 11/30/2022   rt stereo asymetry, coil clip, path pending   BREAST CYST ASPIRATION Left 11/30/2022   BREAST EXCISIONAL BIOPSY Right 2011   negative   BREAST LUMPECTOMY,RADIO FREQ LOCALIZER,AXILLARY SENTINEL LYMPH NODE BIOPSY Right 02/05/2023   Procedure: BREAST LUMPECTOMY,RADIO FREQ LOCALIZER,AXILLARY SENTINEL LYMPH NODE BIOPSY;  Surgeon: Sung Amabile, DO;  Location: [facility];  Service: General;  Laterality: Right;   CATARACT EXTRACTION W/PHACO Left 11/18/2017   Procedure: CATARACT EXTRACTION PHACO AND INTRAOCULAR LENS PLACEMENT (IOC);  Surgeon: Nevada Crane, MD;  Location: [facility];  Service: Ophthalmology;  Laterality: Left;  Lot # [id number] H [place]: 00:28.6 AP%: 10.8 CDE: 3.10    CATARACT EXTRACTION W/PHACO Right 01/13/2018   Procedure: CATARACT EXTRACTION PHACO AND INTRAOCULAR LENS PLACEMENT (IOC);  Surgeon: Nevada Crane, MD;  Location: [facility];  Service: Ophthalmology;  Laterality: Right;  cassette lot #  [id number] H  [place]  00:29.4 AP%   9.6 CDE2.82   COLONOSCOPY     2005, 2009, 2014   COLONOSCOPY WITH PROPOFOL N/A 08/29/2018   Procedure: COLONOSCOPY WITH PROPOFOL;  Surgeon: Scot Jun, MD;  Location: [facility];  Service:  Endoscopy;  Laterality: N/A;   RE-EXCISION OF BREAST CANCER,SUPERIOR MARGINS Right 04/08/2023   Procedure: RE-EXCISION OF RIGHT BREAST MARGINS X2;  Surgeon: Griselda Miner, MD;  Location: [facility];  Service: General;  Laterality: Right;   TONSILLECTOMY     TUBAL LIGATION     VAGINAL HYSTERECTOMY      FAMILY HISTORY: family history includes Breast cancer in her cousin, maternal aunt, and maternal aunt; Multiple myeloma (age of onset: 33) in her brother; Prostate cancer (age of onset: 68) in her brother; Uterine cancer in her maternal grandmother.  SOCIAL HISTORY:  reports that she has never smoked. She has never used smokeless tobacco. She reports that she does not drink alcohol and does not use drugs.  ALLERGIES: Tape  MEDICATIONS:  Current Outpatient Medications  Medication Sig Dispense Refill   acetaminophen (TYLENOL) 325 MG tablet Take 650 mg by mouth every 6 (six) hours as needed.     Biotin w/ Vitamins C & E (HAIR/SKIN/NAILS PO) Take 1 tablet by mouth daily.     Blood Glucose Monitoring Suppl (ONE TOUCH ULTRA 2) w/Device KIT See admin instructions.     Calcium Carbonate-Vitamin D (CALTRATE 600+D PO) Take 1 tablet by mouth daily.     cyanocobalamin (VITAMIN B12) 1000 MCG tablet Take 1,000 mcg by mouth daily.     glipiZIDE (GLUCOTROL) 10 MG tablet Take 20 mg by mouth 2 (two) times daily before a meal.      glucose blood (ONETOUCH ULTRA) test strip Use once daily E11.9     ibuprofen (ADVIL,MOTRIN) 200 MG tablet Take 200 mg by mouth every 6 (six) hours as needed for headache or moderate pain.     Lancets Misc. (UNISTIK 2 NORMAL) MISC Use 1 each once daily Use as instructed. One Touch Ultra lancets E11.9     lidocaine-prilocaine (EMLA) cream Apply topically.     lisinopril-hydrochlorothiazide (PRINZIDE,ZESTORETIC) 20-12.5 MG tablet Take 1 tablet by mouth daily.     Magnesium 250 MG TABS Take 250 mg by mouth daily.     metFORMIN (GLUCOPHAGE) 1000 MG tablet Take 1,000-1,500  mg by mouth See admin instructions. Take 1000 mg by mouth in the morning and take 1500 mg by mouth in the evening     metoprolol succinate (TOPROL-XL) 50 MG 24 hr tablet Take 50 mg by mouth daily. Take with or immediately following a meal.     oxyCODONE (ROXICODONE) 5 MG immediate release tablet Take 1 tablet (5 mg total) by mouth every 6 (six) hours as needed for severe pain. 15 tablet 0   oxyCODONE-acetaminophen (PERCOCET) 5-325 MG tablet Take 1 tablet by mouth every 8 (eight) hours as needed for severe pain. 6 tablet 0   pravastatin (PRAVACHOL) 40 MG tablet Take 40 mg by mouth daily.     sitaGLIPtin (JANUVIA) 100 MG tablet Take 100 mg by mouth daily.     No current facility-administered medications for this encounter.    ECOG PERFORMANCE STATUS:  0 - Asymptomatic  REVIEW OF SYSTEMS: Patient denies any weight loss, fatigue, weakness, fever, chills or night sweats. Patient denies any loss of vision, blurred vision. Patient denies any ringing  of the ears or hearing loss. No irregular heartbeat. Patient denies heart murmur or history of  fainting. Patient denies any chest pain or pain radiating to her upper extremities. Patient denies any shortness of breath, difficulty breathing at night, cough or hemoptysis. Patient denies any swelling in the lower legs. Patient denies any nausea vomiting, vomiting of blood, or coffee ground material in the vomitus. Patient denies any stomach pain. Patient states has had normal bowel movements no significant constipation or diarrhea. Patient denies any dysuria, hematuria or significant nocturia. Patient denies any problems walking, swelling in the joints or loss of balance. Patient denies any skin changes, loss of hair or loss of weight. Patient denies any excessive worrying or anxiety or significant depression. Patient denies any problems with insomnia. Patient denies excessive thirst, polyuria, polydipsia. Patient denies any swollen glands, patient denies easy  bruising or easy bleeding. Patient denies any recent infections, allergies or URI. Patient "s visual fields have not changed significantly in recent time.   PHYSICAL EXAM: BP 139/70   Pulse 67   Temp (!) 97.5 F (36.4 C)   Resp 16   Ht 5\' 4"  (1.626 m)   Wt 203 lb 4.8 oz (92.2 kg)   BMI 34.90 kg/m  Right breast has multiple incisions from her previous surgery all healing well.  No dominant masses noted in either breast.  No axillary or supraclavicular adenopathy separate identified.  Well-developed well-nourished patient in NAD. HEENT reveals PERLA, EOMI, discs not visualized.  Oral cavity is clear. No oral mucosal lesions are identified. Neck is clear without evidence of cervical or supraclavicular adenopathy. Lungs are clear to A&P. Cardiac examination is essentially unremarkable with regular rate and rhythm without murmur rub or thrill. Abdomen is benign with no organomegaly or masses noted. Motor sensory and DTR levels are equal and symmetric in the upper and lower extremities. Cranial nerves II through XII are grossly intact. Proprioception is intact. No peripheral adenopathy or edema is identified. No motor or sensory levels are noted. Crude visual fields are within normal range.  LABORATORY DATA: Pathology reports reviewed    RADIOLOGY RESULTS: Mammogram ultrasound MRI scans reviewed compatible with above-stated findings   IMPRESSION: Stage IIa invasive ductal carcinoma with lobular features of the right breast in 77 year old female ER/PR positive status post wide local excision and reexcision.  PLAN: At this time I have recommended hypofractionated course of whole breast radiation over 3 weeks.  Would also boost her scar another 1000 centigrade using photon beam.  Risks and benefits of treatment including skin reaction fatigue alteration of blood counts possible inclusion of superficial lung all were reviewed in detail with the patient.  She seems to comprehend my treatment plan well.  I  have personally set up and ordered CT simulation for early next week to allow some further healing.  Patient also will be candidate for endocrine therapy after completion of radiation.  Patient comprehends my recommendations well.  I would like to take this opportunity to thank you for allowing me to participate in the care of your patient.Carmina Miller, MD

## 2023-04-28 ENCOUNTER — Telehealth: Payer: Self-pay

## 2023-04-28 NOTE — Telephone Encounter (Signed)
Error

## 2023-05-03 ENCOUNTER — Ambulatory Visit
Admission: RE | Admit: 2023-05-03 | Discharge: 2023-05-03 | Disposition: A | Discharge status: Home / Self Care | Payer: Medicare Other | Source: Ambulatory Visit | Attending: Radiation Oncology | Admitting: Radiation Oncology

## 2023-05-03 DIAGNOSIS — Z510 Encounter for antineoplastic radiation therapy: Secondary | ICD-10-CM | POA: Diagnosis not present

## 2023-05-04 ENCOUNTER — Encounter: Payer: Self-pay | Admitting: *Deleted

## 2023-05-06 DIAGNOSIS — Z510 Encounter for antineoplastic radiation therapy: Secondary | ICD-10-CM | POA: Diagnosis not present

## 2023-05-07 ENCOUNTER — Other Ambulatory Visit: Payer: Self-pay | Admitting: *Deleted

## 2023-05-07 DIAGNOSIS — C50919 Malignant neoplasm of unspecified site of unspecified female breast: Secondary | ICD-10-CM

## 2023-05-10 ENCOUNTER — Ambulatory Visit: Admission: RE | Admit: 2023-05-10 | Payer: Medicare Other | Source: Ambulatory Visit

## 2023-05-10 DIAGNOSIS — Z510 Encounter for antineoplastic radiation therapy: Secondary | ICD-10-CM | POA: Diagnosis not present

## 2023-05-11 ENCOUNTER — Inpatient Hospital Stay: Payer: Medicare Other

## 2023-05-11 ENCOUNTER — Ambulatory Visit
Admission: RE | Admit: 2023-05-11 | Discharge: 2023-05-11 | Disposition: A | Discharge status: Home / Self Care | Payer: Medicare Other | Source: Ambulatory Visit | Attending: Radiation Oncology | Admitting: Radiation Oncology

## 2023-05-11 ENCOUNTER — Other Ambulatory Visit: Payer: Self-pay

## 2023-05-11 DIAGNOSIS — C50919 Malignant neoplasm of unspecified site of unspecified female breast: Secondary | ICD-10-CM

## 2023-05-11 DIAGNOSIS — Z510 Encounter for antineoplastic radiation therapy: Secondary | ICD-10-CM | POA: Diagnosis not present

## 2023-05-11 DIAGNOSIS — Z170 Estrogen receptor positive status [ER+]: Secondary | ICD-10-CM | POA: Insufficient documentation

## 2023-05-11 DIAGNOSIS — C50911 Malignant neoplasm of unspecified site of right female breast: Secondary | ICD-10-CM | POA: Insufficient documentation

## 2023-05-11 LAB — RAD ONC ARIA SESSION SUMMARY
Course Elapsed Days: 0
Plan Fractions Treated to Date: 1
Plan Prescribed Dose Per Fraction: 2.66 Gy
Plan Total Fractions Prescribed: 16
Plan Total Prescribed Dose: 42.56 Gy
Reference Point Dosage Given to Date: 2.66 Gy
Reference Point Session Dosage Given: 2.66 Gy
Session Number: 1

## 2023-05-11 LAB — CBC (CANCER CENTER ONLY)
HCT: 42.1 % (ref 36.0–46.0)
Hemoglobin: 13.6 g/dL (ref 12.0–15.0)
MCH: 27.4 pg (ref 26.0–34.0)
MCHC: 32.3 g/dL (ref 30.0–36.0)
MCV: 84.7 fL (ref 80.0–100.0)
Platelet Count: 238 10*3/uL (ref 150–400)
RBC: 4.97 MIL/uL (ref 3.87–5.11)
RDW: 15.2 % (ref 11.5–15.5)
WBC Count: 6.4 10*3/uL (ref 4.0–10.5)
nRBC: 0 % (ref 0.0–0.2)

## 2023-05-12 ENCOUNTER — Ambulatory Visit
Admission: RE | Admit: 2023-05-12 | Discharge: 2023-05-12 | Disposition: A | Discharge status: Home / Self Care | Payer: Medicare Other | Source: Ambulatory Visit | Attending: Radiation Oncology | Admitting: Radiation Oncology

## 2023-05-12 ENCOUNTER — Other Ambulatory Visit: Payer: Self-pay

## 2023-05-12 DIAGNOSIS — Z510 Encounter for antineoplastic radiation therapy: Secondary | ICD-10-CM | POA: Diagnosis not present

## 2023-05-12 LAB — RAD ONC ARIA SESSION SUMMARY
Course Elapsed Days: 1
Plan Fractions Treated to Date: 2
Plan Prescribed Dose Per Fraction: 2.66 Gy
Plan Total Fractions Prescribed: 16
Plan Total Prescribed Dose: 42.56 Gy
Reference Point Dosage Given to Date: 5.32 Gy
Reference Point Session Dosage Given: 2.66 Gy
Session Number: 2

## 2023-05-13 ENCOUNTER — Ambulatory Visit
Admission: RE | Admit: 2023-05-13 | Discharge: 2023-05-13 | Disposition: A | Discharge status: Home / Self Care | Payer: Medicare Other | Source: Ambulatory Visit | Attending: Radiation Oncology | Admitting: Radiation Oncology

## 2023-05-13 ENCOUNTER — Other Ambulatory Visit: Payer: Self-pay

## 2023-05-13 DIAGNOSIS — Z510 Encounter for antineoplastic radiation therapy: Secondary | ICD-10-CM | POA: Diagnosis not present

## 2023-05-13 LAB — RAD ONC ARIA SESSION SUMMARY
Course Elapsed Days: 2
Plan Fractions Treated to Date: 3
Plan Prescribed Dose Per Fraction: 2.66 Gy
Plan Total Fractions Prescribed: 16
Plan Total Prescribed Dose: 42.56 Gy
Reference Point Dosage Given to Date: 7.98 Gy
Reference Point Session Dosage Given: 2.66 Gy
Session Number: 3

## 2023-05-14 ENCOUNTER — Ambulatory Visit
Admission: RE | Admit: 2023-05-14 | Discharge: 2023-05-14 | Disposition: A | Discharge status: Home / Self Care | Payer: Medicare Other | Source: Ambulatory Visit | Attending: Radiation Oncology | Admitting: Radiation Oncology

## 2023-05-14 ENCOUNTER — Other Ambulatory Visit: Payer: Self-pay

## 2023-05-14 DIAGNOSIS — Z510 Encounter for antineoplastic radiation therapy: Secondary | ICD-10-CM | POA: Diagnosis not present

## 2023-05-14 LAB — RAD ONC ARIA SESSION SUMMARY
Course Elapsed Days: 3
Plan Fractions Treated to Date: 4
Plan Prescribed Dose Per Fraction: 2.66 Gy
Plan Total Fractions Prescribed: 16
Plan Total Prescribed Dose: 42.56 Gy
Reference Point Dosage Given to Date: 10.64 Gy
Reference Point Session Dosage Given: 2.66 Gy
Session Number: 4

## 2023-05-18 ENCOUNTER — Other Ambulatory Visit: Payer: Self-pay

## 2023-05-18 ENCOUNTER — Ambulatory Visit
Admission: RE | Admit: 2023-05-18 | Discharge: 2023-05-18 | Disposition: A | Discharge status: Home / Self Care | Payer: Medicare Other | Source: Ambulatory Visit | Attending: Radiation Oncology | Admitting: Radiation Oncology

## 2023-05-18 DIAGNOSIS — Z510 Encounter for antineoplastic radiation therapy: Secondary | ICD-10-CM | POA: Diagnosis not present

## 2023-05-18 LAB — RAD ONC ARIA SESSION SUMMARY
Course Elapsed Days: 7
Plan Fractions Treated to Date: 5
Plan Prescribed Dose Per Fraction: 2.66 Gy
Plan Total Fractions Prescribed: 16
Plan Total Prescribed Dose: 42.56 Gy
Reference Point Dosage Given to Date: 13.3 Gy
Reference Point Session Dosage Given: 2.66 Gy
Session Number: 5

## 2023-05-19 ENCOUNTER — Other Ambulatory Visit: Payer: Self-pay

## 2023-05-19 ENCOUNTER — Ambulatory Visit
Admission: RE | Admit: 2023-05-19 | Discharge: 2023-05-19 | Disposition: A | Discharge status: Home / Self Care | Payer: Medicare Other | Source: Ambulatory Visit | Attending: Radiation Oncology | Admitting: Radiation Oncology

## 2023-05-19 DIAGNOSIS — Z510 Encounter for antineoplastic radiation therapy: Secondary | ICD-10-CM | POA: Diagnosis not present

## 2023-05-19 LAB — RAD ONC ARIA SESSION SUMMARY
Course Elapsed Days: 8
Plan Fractions Treated to Date: 6
Plan Prescribed Dose Per Fraction: 2.66 Gy
Plan Total Fractions Prescribed: 16
Plan Total Prescribed Dose: 42.56 Gy
Reference Point Dosage Given to Date: 15.96 Gy
Reference Point Session Dosage Given: 2.66 Gy
Session Number: 6

## 2023-05-20 ENCOUNTER — Ambulatory Visit
Admission: RE | Admit: 2023-05-20 | Discharge: 2023-05-20 | Disposition: A | Discharge status: Home / Self Care | Payer: Medicare Other | Source: Ambulatory Visit | Attending: Radiation Oncology | Admitting: Radiation Oncology

## 2023-05-20 ENCOUNTER — Other Ambulatory Visit: Payer: Self-pay

## 2023-05-20 DIAGNOSIS — Z510 Encounter for antineoplastic radiation therapy: Secondary | ICD-10-CM | POA: Diagnosis not present

## 2023-05-20 LAB — RAD ONC ARIA SESSION SUMMARY
Course Elapsed Days: 9
Plan Fractions Treated to Date: 7
Plan Prescribed Dose Per Fraction: 2.66 Gy
Plan Total Fractions Prescribed: 16
Plan Total Prescribed Dose: 42.56 Gy
Reference Point Dosage Given to Date: 18.62 Gy
Reference Point Session Dosage Given: 2.66 Gy
Session Number: 7

## 2023-05-21 ENCOUNTER — Other Ambulatory Visit: Payer: Self-pay

## 2023-05-21 ENCOUNTER — Ambulatory Visit
Admission: RE | Admit: 2023-05-21 | Discharge: 2023-05-21 | Disposition: A | Discharge status: Home / Self Care | Payer: Medicare Other | Source: Ambulatory Visit | Attending: Radiation Oncology | Admitting: Radiation Oncology

## 2023-05-21 DIAGNOSIS — Z510 Encounter for antineoplastic radiation therapy: Secondary | ICD-10-CM | POA: Diagnosis not present

## 2023-05-21 LAB — RAD ONC ARIA SESSION SUMMARY
Course Elapsed Days: 10
Plan Fractions Treated to Date: 8
Plan Prescribed Dose Per Fraction: 2.66 Gy
Plan Total Fractions Prescribed: 16
Plan Total Prescribed Dose: 42.56 Gy
Reference Point Dosage Given to Date: 21.28 Gy
Reference Point Session Dosage Given: 2.66 Gy
Session Number: 8

## 2023-05-24 ENCOUNTER — Other Ambulatory Visit: Payer: Self-pay

## 2023-05-24 ENCOUNTER — Ambulatory Visit
Admission: RE | Admit: 2023-05-24 | Discharge: 2023-05-24 | Disposition: A | Discharge status: Home / Self Care | Payer: Medicare Other | Source: Ambulatory Visit | Attending: Radiation Oncology | Admitting: Radiation Oncology

## 2023-05-24 DIAGNOSIS — Z8042 Family history of malignant neoplasm of prostate: Secondary | ICD-10-CM | POA: Diagnosis not present

## 2023-05-24 DIAGNOSIS — C50411 Malignant neoplasm of upper-outer quadrant of right female breast: Secondary | ICD-10-CM | POA: Insufficient documentation

## 2023-05-24 DIAGNOSIS — Z170 Estrogen receptor positive status [ER+]: Secondary | ICD-10-CM | POA: Diagnosis not present

## 2023-05-24 DIAGNOSIS — Z803 Family history of malignant neoplasm of breast: Secondary | ICD-10-CM | POA: Diagnosis not present

## 2023-05-24 DIAGNOSIS — Z79899 Other long term (current) drug therapy: Secondary | ICD-10-CM | POA: Insufficient documentation

## 2023-05-24 DIAGNOSIS — Z87891 Personal history of nicotine dependence: Secondary | ICD-10-CM | POA: Diagnosis not present

## 2023-05-24 DIAGNOSIS — Z510 Encounter for antineoplastic radiation therapy: Secondary | ICD-10-CM | POA: Diagnosis not present

## 2023-05-24 DIAGNOSIS — I129 Hypertensive chronic kidney disease with stage 1 through stage 4 chronic kidney disease, or unspecified chronic kidney disease: Secondary | ICD-10-CM | POA: Diagnosis not present

## 2023-05-24 DIAGNOSIS — Z7984 Long term (current) use of oral hypoglycemic drugs: Secondary | ICD-10-CM | POA: Diagnosis not present

## 2023-05-24 DIAGNOSIS — E1122 Type 2 diabetes mellitus with diabetic chronic kidney disease: Secondary | ICD-10-CM | POA: Diagnosis not present

## 2023-05-24 DIAGNOSIS — E7800 Pure hypercholesterolemia, unspecified: Secondary | ICD-10-CM | POA: Diagnosis not present

## 2023-05-24 DIAGNOSIS — N1832 Chronic kidney disease, stage 3b: Secondary | ICD-10-CM | POA: Insufficient documentation

## 2023-05-24 LAB — RAD ONC ARIA SESSION SUMMARY
Course Elapsed Days: 13
Plan Fractions Treated to Date: 9
Plan Prescribed Dose Per Fraction: 2.66 Gy
Plan Total Fractions Prescribed: 16
Plan Total Prescribed Dose: 42.56 Gy
Reference Point Dosage Given to Date: 23.94 Gy
Reference Point Session Dosage Given: 2.66 Gy
Session Number: 9

## 2023-05-25 ENCOUNTER — Ambulatory Visit
Admission: RE | Admit: 2023-05-25 | Discharge: 2023-05-25 | Disposition: A | Discharge status: Home / Self Care | Payer: Medicare Other | Source: Ambulatory Visit | Attending: Radiation Oncology | Admitting: Radiation Oncology

## 2023-05-25 ENCOUNTER — Other Ambulatory Visit: Payer: Self-pay

## 2023-05-25 ENCOUNTER — Inpatient Hospital Stay: Payer: Medicare Other

## 2023-05-25 DIAGNOSIS — Z510 Encounter for antineoplastic radiation therapy: Secondary | ICD-10-CM | POA: Diagnosis not present

## 2023-05-25 DIAGNOSIS — C50919 Malignant neoplasm of unspecified site of unspecified female breast: Secondary | ICD-10-CM

## 2023-05-25 DIAGNOSIS — C50911 Malignant neoplasm of unspecified site of right female breast: Secondary | ICD-10-CM | POA: Insufficient documentation

## 2023-05-25 LAB — RAD ONC ARIA SESSION SUMMARY
Course Elapsed Days: 14
Plan Fractions Treated to Date: 10
Plan Prescribed Dose Per Fraction: 2.66 Gy
Plan Total Fractions Prescribed: 16
Plan Total Prescribed Dose: 42.56 Gy
Reference Point Dosage Given to Date: 26.6 Gy
Reference Point Session Dosage Given: 2.66 Gy
Session Number: 10

## 2023-05-25 LAB — CBC (CANCER CENTER ONLY)
HCT: 41.6 % (ref 36.0–46.0)
Hemoglobin: 13.4 g/dL (ref 12.0–15.0)
MCH: 27.5 pg (ref 26.0–34.0)
MCHC: 32.2 g/dL (ref 30.0–36.0)
MCV: 85.2 fL (ref 80.0–100.0)
Platelet Count: 237 10*3/uL (ref 150–400)
RBC: 4.88 MIL/uL (ref 3.87–5.11)
RDW: 15 % (ref 11.5–15.5)
WBC Count: 5.2 10*3/uL (ref 4.0–10.5)
nRBC: 0 % (ref 0.0–0.2)

## 2023-05-26 ENCOUNTER — Other Ambulatory Visit: Payer: Self-pay

## 2023-05-26 ENCOUNTER — Ambulatory Visit
Admission: RE | Admit: 2023-05-26 | Discharge: 2023-05-26 | Disposition: A | Discharge status: Home / Self Care | Payer: Medicare Other | Source: Ambulatory Visit | Attending: Radiation Oncology | Admitting: Radiation Oncology

## 2023-05-26 DIAGNOSIS — Z510 Encounter for antineoplastic radiation therapy: Secondary | ICD-10-CM | POA: Diagnosis not present

## 2023-05-26 LAB — RAD ONC ARIA SESSION SUMMARY
Course Elapsed Days: 15
Plan Fractions Treated to Date: 11
Plan Prescribed Dose Per Fraction: 2.66 Gy
Plan Total Fractions Prescribed: 16
Plan Total Prescribed Dose: 42.56 Gy
Reference Point Dosage Given to Date: 29.26 Gy
Reference Point Session Dosage Given: 2.66 Gy
Session Number: 11

## 2023-05-27 ENCOUNTER — Ambulatory Visit
Admission: RE | Admit: 2023-05-27 | Discharge: 2023-05-27 | Disposition: A | Discharge status: Home / Self Care | Payer: Medicare Other | Source: Ambulatory Visit | Attending: Radiation Oncology | Admitting: Radiation Oncology

## 2023-05-27 ENCOUNTER — Other Ambulatory Visit: Payer: Self-pay

## 2023-05-27 DIAGNOSIS — Z510 Encounter for antineoplastic radiation therapy: Secondary | ICD-10-CM | POA: Diagnosis not present

## 2023-05-27 LAB — RAD ONC ARIA SESSION SUMMARY
Course Elapsed Days: 16
Plan Fractions Treated to Date: 12
Plan Prescribed Dose Per Fraction: 2.66 Gy
Plan Total Fractions Prescribed: 16
Plan Total Prescribed Dose: 42.56 Gy
Reference Point Dosage Given to Date: 31.92 Gy
Reference Point Session Dosage Given: 2.66 Gy
Session Number: 12

## 2023-05-28 ENCOUNTER — Other Ambulatory Visit: Payer: Self-pay

## 2023-05-28 ENCOUNTER — Ambulatory Visit
Admission: RE | Admit: 2023-05-28 | Discharge: 2023-05-28 | Disposition: A | Discharge status: Home / Self Care | Payer: Medicare Other | Source: Ambulatory Visit | Attending: Radiation Oncology | Admitting: Radiation Oncology

## 2023-05-28 DIAGNOSIS — Z510 Encounter for antineoplastic radiation therapy: Secondary | ICD-10-CM | POA: Diagnosis not present

## 2023-05-28 LAB — RAD ONC ARIA SESSION SUMMARY
Course Elapsed Days: 17
Plan Fractions Treated to Date: 13
Plan Prescribed Dose Per Fraction: 2.66 Gy
Plan Total Fractions Prescribed: 16
Plan Total Prescribed Dose: 42.56 Gy
Reference Point Dosage Given to Date: 34.58 Gy
Reference Point Session Dosage Given: 2.66 Gy
Session Number: 13

## 2023-05-31 ENCOUNTER — Ambulatory Visit
Admission: RE | Admit: 2023-05-31 | Discharge: 2023-05-31 | Disposition: A | Discharge status: Home / Self Care | Payer: Medicare Other | Source: Ambulatory Visit | Attending: Radiation Oncology | Admitting: Radiation Oncology

## 2023-05-31 ENCOUNTER — Other Ambulatory Visit: Payer: Self-pay

## 2023-05-31 DIAGNOSIS — Z510 Encounter for antineoplastic radiation therapy: Secondary | ICD-10-CM | POA: Diagnosis not present

## 2023-05-31 LAB — RAD ONC ARIA SESSION SUMMARY
Course Elapsed Days: 20
Plan Fractions Treated to Date: 14
Plan Prescribed Dose Per Fraction: 2.66 Gy
Plan Total Fractions Prescribed: 16
Plan Total Prescribed Dose: 42.56 Gy
Reference Point Dosage Given to Date: 37.24 Gy
Reference Point Session Dosage Given: 2.66 Gy
Session Number: 14

## 2023-06-01 ENCOUNTER — Other Ambulatory Visit: Payer: Self-pay

## 2023-06-01 ENCOUNTER — Ambulatory Visit
Admission: RE | Admit: 2023-06-01 | Discharge: 2023-06-01 | Disposition: A | Discharge status: Home / Self Care | Payer: Medicare Other | Source: Ambulatory Visit | Attending: Radiation Oncology | Admitting: Radiation Oncology

## 2023-06-01 DIAGNOSIS — Z510 Encounter for antineoplastic radiation therapy: Secondary | ICD-10-CM | POA: Diagnosis not present

## 2023-06-01 LAB — RAD ONC ARIA SESSION SUMMARY
Course Elapsed Days: 21
Plan Fractions Treated to Date: 15
Plan Prescribed Dose Per Fraction: 2.66 Gy
Plan Total Fractions Prescribed: 16
Plan Total Prescribed Dose: 42.56 Gy
Reference Point Dosage Given to Date: 39.9 Gy
Reference Point Session Dosage Given: 2.66 Gy
Session Number: 15

## 2023-06-02 ENCOUNTER — Ambulatory Visit
Admission: RE | Admit: 2023-06-02 | Discharge: 2023-06-02 | Disposition: A | Discharge status: Home / Self Care | Payer: Medicare Other | Source: Ambulatory Visit | Attending: Radiation Oncology | Admitting: Radiation Oncology

## 2023-06-02 ENCOUNTER — Other Ambulatory Visit: Payer: Self-pay

## 2023-06-02 ENCOUNTER — Ambulatory Visit: Admission: RE | Admit: 2023-06-02 | Payer: Medicare Other | Source: Ambulatory Visit

## 2023-06-02 DIAGNOSIS — Z510 Encounter for antineoplastic radiation therapy: Secondary | ICD-10-CM | POA: Diagnosis not present

## 2023-06-02 LAB — RAD ONC ARIA SESSION SUMMARY
Course Elapsed Days: 22
Plan Fractions Treated to Date: 16
Plan Prescribed Dose Per Fraction: 2.66 Gy
Plan Total Fractions Prescribed: 16
Plan Total Prescribed Dose: 42.56 Gy
Reference Point Dosage Given to Date: 42.56 Gy
Reference Point Session Dosage Given: 2.66 Gy
Session Number: 16

## 2023-06-03 ENCOUNTER — Other Ambulatory Visit: Payer: Self-pay

## 2023-06-03 ENCOUNTER — Ambulatory Visit
Admission: RE | Admit: 2023-06-03 | Discharge: 2023-06-03 | Disposition: A | Discharge status: Home / Self Care | Payer: Medicare Other | Source: Ambulatory Visit | Attending: Radiation Oncology | Admitting: Radiation Oncology

## 2023-06-03 DIAGNOSIS — Z510 Encounter for antineoplastic radiation therapy: Secondary | ICD-10-CM | POA: Diagnosis not present

## 2023-06-03 LAB — RAD ONC ARIA SESSION SUMMARY
Course Elapsed Days: 23
Plan Fractions Treated to Date: 1
Plan Prescribed Dose Per Fraction: 2 Gy
Plan Total Fractions Prescribed: 5
Plan Total Prescribed Dose: 10 Gy
Reference Point Dosage Given to Date: 2 Gy
Reference Point Session Dosage Given: 2 Gy
Session Number: 17

## 2023-06-04 ENCOUNTER — Other Ambulatory Visit: Payer: Self-pay

## 2023-06-04 ENCOUNTER — Ambulatory Visit
Admission: RE | Admit: 2023-06-04 | Discharge: 2023-06-04 | Disposition: A | Discharge status: Home / Self Care | Payer: Medicare Other | Source: Ambulatory Visit | Attending: Radiation Oncology | Admitting: Radiation Oncology

## 2023-06-04 DIAGNOSIS — Z510 Encounter for antineoplastic radiation therapy: Secondary | ICD-10-CM | POA: Diagnosis not present

## 2023-06-04 LAB — RAD ONC ARIA SESSION SUMMARY
Course Elapsed Days: 24
Plan Fractions Treated to Date: 2
Plan Prescribed Dose Per Fraction: 2 Gy
Plan Total Fractions Prescribed: 5
Plan Total Prescribed Dose: 10 Gy
Reference Point Dosage Given to Date: 4 Gy
Reference Point Session Dosage Given: 2 Gy
Session Number: 18

## 2023-06-07 ENCOUNTER — Other Ambulatory Visit: Payer: Self-pay

## 2023-06-07 ENCOUNTER — Ambulatory Visit
Admission: RE | Admit: 2023-06-07 | Discharge: 2023-06-07 | Disposition: A | Discharge status: Home / Self Care | Payer: Medicare Other | Source: Ambulatory Visit | Attending: Radiation Oncology | Admitting: Radiation Oncology

## 2023-06-07 DIAGNOSIS — Z510 Encounter for antineoplastic radiation therapy: Secondary | ICD-10-CM | POA: Diagnosis not present

## 2023-06-07 LAB — RAD ONC ARIA SESSION SUMMARY
Course Elapsed Days: 27
Plan Fractions Treated to Date: 3
Plan Prescribed Dose Per Fraction: 2 Gy
Plan Total Fractions Prescribed: 5
Plan Total Prescribed Dose: 10 Gy
Reference Point Dosage Given to Date: 6 Gy
Reference Point Session Dosage Given: 2 Gy
Session Number: 19

## 2023-06-08 ENCOUNTER — Inpatient Hospital Stay: Payer: Medicare Other

## 2023-06-08 ENCOUNTER — Other Ambulatory Visit: Payer: Self-pay | Admitting: *Deleted

## 2023-06-08 ENCOUNTER — Ambulatory Visit
Admission: RE | Admit: 2023-06-08 | Discharge: 2023-06-08 | Disposition: A | Discharge status: Home / Self Care | Payer: Medicare Other | Source: Ambulatory Visit | Attending: Radiation Oncology | Admitting: Radiation Oncology

## 2023-06-08 ENCOUNTER — Other Ambulatory Visit: Payer: Self-pay

## 2023-06-08 DIAGNOSIS — Z510 Encounter for antineoplastic radiation therapy: Secondary | ICD-10-CM | POA: Diagnosis not present

## 2023-06-08 DIAGNOSIS — C50919 Malignant neoplasm of unspecified site of unspecified female breast: Secondary | ICD-10-CM

## 2023-06-08 LAB — CBC (CANCER CENTER ONLY)
HCT: 40.6 % (ref 36.0–46.0)
Hemoglobin: 13.3 g/dL (ref 12.0–15.0)
MCH: 27.7 pg (ref 26.0–34.0)
MCHC: 32.8 g/dL (ref 30.0–36.0)
MCV: 84.4 fL (ref 80.0–100.0)
Platelet Count: 225 10*3/uL (ref 150–400)
RBC: 4.81 MIL/uL (ref 3.87–5.11)
RDW: 14.8 % (ref 11.5–15.5)
WBC Count: 4.5 10*3/uL (ref 4.0–10.5)
nRBC: 0 % (ref 0.0–0.2)

## 2023-06-08 LAB — RAD ONC ARIA SESSION SUMMARY
Course Elapsed Days: 28
Plan Fractions Treated to Date: 4
Plan Prescribed Dose Per Fraction: 2 Gy
Plan Total Fractions Prescribed: 5
Plan Total Prescribed Dose: 10 Gy
Reference Point Dosage Given to Date: 8 Gy
Reference Point Session Dosage Given: 2 Gy
Session Number: 20

## 2023-06-08 MED ORDER — SILVER SULFADIAZINE 1 % EX CREA: 1.0000 | TOPICAL_CREAM | Freq: Every day | CUTANEOUS | 0 refills | Status: DC

## 2023-06-09 ENCOUNTER — Ambulatory Visit
Admission: RE | Admit: 2023-06-09 | Discharge: 2023-06-09 | Disposition: A | Discharge status: Home / Self Care | Payer: Medicare Other | Source: Ambulatory Visit | Attending: Radiation Oncology | Admitting: Radiation Oncology

## 2023-06-09 ENCOUNTER — Encounter: Payer: Self-pay | Admitting: *Deleted

## 2023-06-09 ENCOUNTER — Other Ambulatory Visit: Payer: Self-pay

## 2023-06-09 DIAGNOSIS — Z510 Encounter for antineoplastic radiation therapy: Secondary | ICD-10-CM | POA: Diagnosis not present

## 2023-06-09 LAB — RAD ONC ARIA SESSION SUMMARY
Course Elapsed Days: 29
Plan Fractions Treated to Date: 5
Plan Prescribed Dose Per Fraction: 2 Gy
Plan Total Fractions Prescribed: 5
Plan Total Prescribed Dose: 10 Gy
Reference Point Dosage Given to Date: 10 Gy
Reference Point Session Dosage Given: 2 Gy
Session Number: 21

## 2023-06-23 ENCOUNTER — Encounter: Payer: Self-pay | Admitting: *Deleted

## 2023-06-23 ENCOUNTER — Inpatient Hospital Stay: Payer: Medicare Other | Attending: Oncology | Admitting: Oncology

## 2023-06-23 ENCOUNTER — Encounter: Payer: Self-pay | Admitting: Oncology

## 2023-06-23 VITALS — BP 154/72 | HR 77 | Temp 97.7°F | Ht 64.0 in | Wt 204.5 lb

## 2023-06-23 DIAGNOSIS — Z90710 Acquired absence of both cervix and uterus: Secondary | ICD-10-CM | POA: Insufficient documentation

## 2023-06-23 DIAGNOSIS — Z8042 Family history of malignant neoplasm of prostate: Secondary | ICD-10-CM | POA: Insufficient documentation

## 2023-06-23 DIAGNOSIS — Z8049 Family history of malignant neoplasm of other genital organs: Secondary | ICD-10-CM | POA: Diagnosis not present

## 2023-06-23 DIAGNOSIS — N1832 Chronic kidney disease, stage 3b: Secondary | ICD-10-CM | POA: Diagnosis not present

## 2023-06-23 DIAGNOSIS — Z923 Personal history of irradiation: Secondary | ICD-10-CM | POA: Diagnosis not present

## 2023-06-23 DIAGNOSIS — Z807 Family history of other malignant neoplasms of lymphoid, hematopoietic and related tissues: Secondary | ICD-10-CM | POA: Diagnosis not present

## 2023-06-23 DIAGNOSIS — Z803 Family history of malignant neoplasm of breast: Secondary | ICD-10-CM | POA: Insufficient documentation

## 2023-06-23 DIAGNOSIS — C50911 Malignant neoplasm of unspecified site of right female breast: Secondary | ICD-10-CM | POA: Diagnosis not present

## 2023-06-23 DIAGNOSIS — N1831 Chronic kidney disease, stage 3a: Secondary | ICD-10-CM | POA: Diagnosis not present

## 2023-06-23 DIAGNOSIS — C50919 Malignant neoplasm of unspecified site of unspecified female breast: Secondary | ICD-10-CM | POA: Diagnosis not present

## 2023-06-23 DIAGNOSIS — Z170 Estrogen receptor positive status [ER+]: Secondary | ICD-10-CM | POA: Insufficient documentation

## 2023-06-23 DIAGNOSIS — M8580 Other specified disorders of bone density and structure, unspecified site: Secondary | ICD-10-CM | POA: Diagnosis not present

## 2023-06-23 DIAGNOSIS — Z79811 Long term (current) use of aromatase inhibitors: Secondary | ICD-10-CM | POA: Diagnosis not present

## 2023-06-23 MED ORDER — ANASTROZOLE 1 MG PO TABS: 1.0000 mg | ORAL_TABLET | Freq: Every day | ORAL | 3 refills | Status: DC

## 2023-06-23 NOTE — Assessment & Plan Note (Addendum)
Right breast invasive carcinoma. 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  Positive posterior margin. She underwent re-excision, no residual disease.  Oncotype DX  on both mass were low, no chemotherapy benefit  S/p adjuvant radiation.  Recommend adjuvant endocrine thearpy Rationale of using aromatase inhibitor -Arimidex  discussed with patient.  Side effects of Arimidex including but not limited to hot flash, muscle and joint pain, fatigue, mood swing, vaginal dryness/inching, loss of bone mineral density,hair thinning, heart problem, etc were discussed with patient. Patient voices understanding and willing to proceed.   Annual mammogram surveillance - next due Nov 2024

## 2023-06-23 NOTE — Progress Notes (Signed)
C/o skin turning dark and peeling right breast area/axilla. She would like to know how long it may take for this to go back to normal color?

## 2023-06-23 NOTE — Progress Notes (Signed)
Hematology/Oncology Consult Note [phone] [phone]  CHIEF COMPLAINTS/PURPOSE OF CONSULTATION:  Right breast invasive mammary carcinoma.   ASSESSMENT & PLAN:   Invasive carcinoma of breast [facility]) Surgical pathology results were reviewed with patient.  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  Positive posterior margin. She underwent re-excision, no residual disease.  Oncotype DX  on both mass were low, no chemotherapy benefit  S/p adjuvant radiation.  Recommend adjuvant endocrine thearpy Rationale of using aromatase inhibitor -Arimidex  discussed with patient.  Side effects of Arimidex including but not limited to hot flash, muscle and joint pain, fatigue, mood swing, vaginal dryness/inching, loss of bone mineral density,hair thinning, heart problem, etc were discussed with patient. Patient voices understanding and willing to proceed.     CKD (chronic kidney disease) stage 3, GFR 30-59 ml/min (HCC) Encourage oral hydration and avoid nephrotoxins.    Osteopenia Recommend calcium and vitamin D supplementation.  Obtain baseline DEXA  . Orders Placed This Encounter  Procedures   DG Bone Density    Standing Status:   Future    Standing Expiration Date:   06/22/2024    Order Specific Question:   Reason for Exam (SYMPTOM  OR DIAGNOSIS REQUIRED)    Answer:   breast cancer    Order Specific Question:   Preferred imaging location?    Answer:   [facility]   CBC with Differential (Cancer Center Only)    Standing Status:   Future    Standing Expiration Date:   06/22/2024   CMP (Cancer Center only)    Standing Status:   Future    Standing Expiration Date:   06/22/2024   Follow-up 3 months  Rickard Patience, MD, PhD [facility] Hematology Oncology 06/23/2023   HISTORY OF PRESENTING ILLNESS:  Emma Torres 77 y.o. female presents to establish care for Right breast invasive mammary carcinoma I have reviewed her chart and materials related to her cancer extensively and  collaborated history with the patient. Summary of oncologic history is as follows: Oncology History  Invasive carcinoma of breast (HCC)  10/24/2021 Mammogram   Bilateral diagnostic mammogram 1.  Stable appearance of probably benign left breast mass. 2.  No findings of malignancy in either breast.   11/09/2022 Mammogram   Bilateral diagnostic mammogram and [place] bilateral breast  1. There is an area of possible subtle architectural distortion in the RIGHT upper breast at posterior depth. Recommend stereotactic guided biopsy for definitive characterization. 2. There is a 14 mm mass in the LEFT breast at 3 o'clock 2 cm rom the nipple. This likely reflects a complicated cyst. Given hypoechoic appearance on today's exam, recommend ultrasound-guidedaspiration for definitive characterization. 3. Stable LEFT breast mass at 12 o'clock for greater than 2 years, consistent with a benign etiology.   12/23/2022 Initial Diagnosis   Invasive carcinoma of breast  Patient gets mammogram surveillance for left breast mass which has been stable. Recent mammogram showed right breast distortion.   11/30/2022 Right breast stereotactic biopsy showed Invasive mammary carcinoma with mixed lobular and ductal features, Grade 2, no LVI, ER 90%+, PR 90%+, HER2 - [facility] 0]   Menarche at age of 35 First live birth at age of 30 OCP use: denies History of hysterectomy: yes Menopausal status: postmenopausal History of HRT use: 2 years History of chest radiation: denies Number of previous breast biopsies:  right breast.    12/23/2022 Cancer Staging   Staging form: Breast, AJCC 8th Edition - Clinical: Stage Unknown (cTX, cN0, cM0, G2, ER+, PR+,  HER2-) - Signed by Rickard Patience, MD on 12/23/2022 Histologic grading system: 3 grade system    Genetic Testing   No pathogenic variants identified on the [facility] Multi-Cancer+RNA panel. 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 The report date is 01/30/2023.  The Multi-Cancer +  RNA Panel offered by [facility] includes sequencing and/or deletion/duplication analysis of the following 70 genes:  AIP*, ALK, APC*, ATM*, AXIN2*, BAP1*, BARD1*, BLM*, BMPR1A*, BRCA1*, BRCA2*, BRIP1*, CDC73*, CDH1*, CDK4, CDKN1B*, CDKN2A, CHEK2*, CTNNA1*, DICER1*, EPCAM, EGFR, FH*, FLCN*, GREM1, HOXB13, KIT, LZTR1, MAX*, MBD4, MEN1*, MET, MITF, MLH1*, MSH2*, MSH3*, MSH6*, MUTYH*, NF1*, NF2*, NTHL1*, PALB2*, PDGFRA, PMS2*, POLD1*, POLE*, POT1*, PRKAR1A*, PTCH1*, PTEN*, RAD51C*, RAD51D*, RB1*, RET, SDHA*, SDHAF2*, SDHB*, SDHC*, SDHD*, SMAD4*, SMARCA4*, SMARCB1*, SMARCE1*, STK11*, SUFU*, TMEM127*, TP53*, TSC1*, TSC2*, VHL*. RNA analysis is performed for * genes.   12/29/2022 Imaging   Bilateral MRI breast w wo contrast  1. Indeterminate 0.6 cm mass involving the UPPER INNER QUADRANT of the RIGHT breast at anterior depth. 2. Indeterminate 1.7 cm linear non-mass enhancement involving the LOWER OUTER QUADRANT of the LEFT breast at middle to posterior depth. 3. Indeterminate 1.8 cm mass involving the LOWER OUTER QUADRANT of the LEFT breast at far posterior depth. 4. No pathologic lymphadenopathy. 5. 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   01/25/2023 Procedure   Patient underwent MRI biopsy of right breast upper inner quadrant anterior depth as well as non-mass enhancement in the lower outer quadrant left breast and indeterminate mass involving lower outer quadrant of the left breast.  Pathology showed 1. Breast, right, needle core biopsy, upper anterior INVASIVE MODERATELY DIFFERENTIATED DUCTAL CARCINOMA WITH LOBULAR FEATURES, GRADE 2 (3+2+1) FOCAL DUCTAL CARCINOMA IN SITU, INTERMEDIATE NUCLEAR GRADE, SOLID TYPE WITHOUT NECROSIS TUBULE FORMATION: SCORE 3 NUCLEAR PLEOMORPHISM: SCORE 2 MITOTIC COUNT: SCORE 1 TOTAL SCORE: 6 OF 9 OVERALL GRADE: GRADE 2 RARE MICROCALCIFICATION PRESENT WITHIN BENIGN DUCT NEGATIVE FOR ANGIOLYMPHATIC  INVASION TUMOR MEASURES 4 MM IN GREATEST LINEAR EXTENT ER 95% positive, PR 90% positive, Ki-67 5%.  HER2 negative [IHC1+].  2. Breast, left, needle core biopsy, lateral BENIGN BREAST WITH FIBROCYSTIC CHANGES INCLUDING STROMAL FIBROSIS, APOCRINE METAPLASIA, ADENOSIS AND USUAL DUCT HYPERPLASIA MICROCALCIFICATIONS PRESENT WITHIN DUCT HYPERPLASIA AND ADENOSIS NEGATIVE FOR CARCINOMA  3. Breast, left, needle core biopsy, LOQ CONSISTENT WITH BENIGN FIBROADENOMA ADJACENT FIBROCYSTIC CHANGES INCLUDING STROMAL FIBROSIS AND ADENOSIS RARE MICROCALCIFICATION WITHIN A FIBROADENOMA NEGATIVE FOR CARCINOMA    02/05/2023 Surgery   Patient underwent right breast lumpectomy of right anterior mass, right posterior mass and right medial margin reexcision with sentinel lymph node biopsy Pathology showed pT2 pN0   A.  BREAST, RIGHT, ANTERIOR MASS; EXCISION:- MULTIPLE FOCI (at least 6) OF INVASIVE DUCTAL CARCINOMA WITH LOBULAR FEATURES, LARGEST FOCI 6.2 MM; MARGINS NEGATIVE.  Oncotype DX recurrence score of this mass is 24, <1% chemotherapy benefit.]   B.  BREAST, RIGHT, POSTERIOR MASS; EXCISION: - MULTIFOCAL INVASIVE DUCTAL CARCINOMA WITH LOBULAR FEATURES, LARGEST FOCI 21 mm, LARGEST FOCI FOCALLY EXTENDS TO MEDIAL MARGIN (REEXCISED IN C), SEPARATE 1 MM FOCI EXTENDS TO INFERIOR MARGIN [Oncotype DX recurrence score of this mass is 12, no chemotherapy benefit.]  C.  BREAST, RIGHT, MEDIAL MARGIN, REEXCISION: - FOCI OF INVASIVE DUCTAL CARCINOMA WITH LOBULAR FEATURES 2.5 mm, NOT AT MARGIN.  D.  RIGHT SENTINEL LYMPH NODE; EXCISION: - NO EVIDENCE OF METASTATIC CARCINOMA IN 1 SENTINEL LYMPH NODE   TUMOR  Histologic Type: Invasive ductal carcinoma with lobular features.  Histologic Grade ([place] Histologic Score)  Glandular (Acinar)/Tubular Differentiation: 3 (of 3)       Nuclear Pleomorphism: 2 (of 3)       Mitotic Rate: 1 (56f 3)       Overall Grade: 2 (of 3)  Tumor Size: 6.2 mm  Tumor  Focality: Multifocal  Ductal Carcinoma In Situ (DCIS): Not identified  Tumor Extent: N/A  Lymphatic and/or Vascular Invasion: Not identified  Treatment Effect in the Breast: No known presurgical therapy   MARGINS  Margin Status for Invasive Carcinoma: All margins negative for invasive  carcinoma      Distance from closest margin: 1.6 mm       Specify closest margin: Posterior      03/02/2023 Cancer Staging   Staging form: Breast, AJCC 8th Edition - Pathologic: Stage IA (pT2, pN0, cM0, G2, ER+, PR+, HER2-, Oncotype DX score: 12) - Signed by Rickard Patience, MD on 03/02/2023 Stage prefix: Initial diagnosis Multigene prognostic tests performed: Oncotype DX Recurrence score range: Greater than or equal to 11 Histologic grading system: 3 grade system    Patient has positive family history of breast cancer and uterine cancer.    INTERVAL HISTORY Emma Torres is a 77 y.o. female who has above history reviewed by me today presents for follow up visit for right breast cancer.  She finished radiation. Overall she tolerates well. + skin peeling and + increased hyperpigmentation.  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)    Cough    chronic   Diverticulosis    Hypercholesterolemia    Hypertension    IBS (irritable bowel syndrome)    Stage 3b chronic kidney disease (CKD) (HCC)     SURGICAL HISTORY: Past Surgical History:  Procedure Laterality Date   ABDOMINAL HYSTERECTOMY     BREAST BIOPSY Right 11/30/2022   MM RT BREAST BX W LOC DEV 1ST LESION IMAGE BX SPEC STEREO GUIDE 11/30/2022 [facility]   BREAST BIOPSY Right 12/24/2022   MM RT RADIO FREQUENCY TAG LOC MAMMO GUIDE 12/24/2022 [facility]   BREAST BIOPSY Right 02/02/2023   MM RT RADIO FREQUENCY TAG LOC MAMMO GUIDE 02/02/2023  [facility]   breast biospy Right 11/30/2022   rt stereo asymetry, coil clip, path pending   BREAST CYST ASPIRATION Left 11/30/2022   BREAST EXCISIONAL BIOPSY Right 2011   negative   BREAST LUMPECTOMY,RADIO FREQ LOCALIZER,AXILLARY SENTINEL LYMPH NODE BIOPSY Right 02/05/2023   Procedure: BREAST LUMPECTOMY,RADIO FREQ LOCALIZER,AXILLARY SENTINEL LYMPH NODE BIOPSY;  Surgeon: Sung Amabile, DO;  Location: [facility];  Service: General;  Laterality: Right;   CATARACT EXTRACTION W/PHACO Left 11/18/2017   Procedure: CATARACT EXTRACTION PHACO AND INTRAOCULAR LENS PLACEMENT (IOC);  Surgeon: Nevada Crane, MD;  Location: [facility];  Service: Ophthalmology;  Laterality: Left;  Lot # [id number] H [place]: 00:28.6 AP%: 10.8 CDE: 3.10    CATARACT EXTRACTION W/PHACO Right 01/13/2018   Procedure: CATARACT EXTRACTION PHACO AND INTRAOCULAR LENS PLACEMENT (IOC);  Surgeon: Nevada Crane, MD;  Location: [facility];  Service: Ophthalmology;  Laterality: Right;  cassette lot #  [id number]  [place]   00:29.4 AP%   9.6 CDE2.82   COLONOSCOPY     2005, 2009, 2014   COLONOSCOPY WITH PROPOFOL N/A 08/29/2018   Procedure: COLONOSCOPY WITH PROPOFOL;  Surgeon: Scot Jun, MD;  Location: [facility]  ENDOSCOPY;  Service: Endoscopy;  Laterality: N/A;   RE-EXCISION OF BREAST CANCER,SUPERIOR MARGINS Right 04/08/2023   Procedure: RE-EXCISION OF RIGHT BREAST MARGINS X2;  Surgeon: Griselda Miner, MD;  Location:  SURGERY CENTER;  Service: General;  Laterality: Right;   TONSILLECTOMY     TUBAL LIGATION     VAGINAL HYSTERECTOMY      SOCIAL HISTORY: Social History   Socioeconomic History   Marital status: Married    Spouse name: Homero Fellers   Number of children: 2   Years of education: Not on file   Highest education level: Not on file  Occupational History   Not on file  Tobacco Use   Smoking status: Never   Smokeless tobacco: Never  Vaping Use   Vaping Use: Never used  Substance and Sexual Activity   Alcohol use: No    Drug use: No   Sexual activity: Not on file  Other Topics Concern   Not on file  Social History Narrative   Not on file   Social Determinants of Health   Financial Resource Strain: Not on file  Food Insecurity: Not on file  Transportation Needs: Not on file  Physical Activity: Not on file  Stress: Not on file  Social Connections: Not on file  Intimate Partner Violence: Not on file    FAMILY HISTORY: Family History  Problem Relation Age of Onset   Prostate cancer Brother 40   Multiple myeloma Brother 18   Breast cancer Maternal Aunt        dx 70s   Breast cancer Maternal Aunt        dx 91s   Uterine cancer Maternal Grandmother        dx 68s   Breast cancer Cousin        dx 36s    ALLERGIES:  is allergic to tape.  MEDICATIONS:  Current Outpatient Medications  Medication Sig Dispense Refill   acetaminophen (TYLENOL) 325 MG tablet Take 650 mg by mouth every 6 (six) hours as needed.     anastrozole (ARIMIDEX) 1 MG tablet Take 1 tablet (1 mg total) by mouth daily. 30 tablet 3   Biotin w/ Vitamins C & E (HAIR/SKIN/NAILS PO) Take 1 tablet by mouth daily.     Blood Glucose Monitoring Suppl (ONE TOUCH ULTRA 2) w/Device KIT See admin instructions.     Calcium Carbonate-Vitamin D (CALTRATE 600+D PO) Take 1 tablet by mouth daily.     cyanocobalamin (VITAMIN B12) 1000 MCG tablet Take 1,000 mcg by mouth daily.     glipiZIDE (GLUCOTROL) 10 MG tablet Take 20 mg by mouth 2 (two) times daily before a meal.      glucose blood (ONETOUCH ULTRA) test strip Use once daily E11.9     ibuprofen (ADVIL,MOTRIN) 200 MG tablet Take 200 mg by mouth every 6 (six) hours as needed for headache or moderate pain.     Lancets Misc. (UNISTIK 2 NORMAL) MISC Use 1 each once daily Use as instructed. One Touch Ultra lancets E11.9     lidocaine-prilocaine (EMLA) cream Apply topically.     lisinopril-hydrochlorothiazide (PRINZIDE,ZESTORETIC) 20-12.5 MG tablet Take 1 tablet by mouth daily.     Magnesium 250  MG TABS Take 250 mg by mouth daily.     metFORMIN (GLUCOPHAGE) 1000 MG tablet Take 1,000-1,500 mg by mouth See admin instructions. Take 1000 mg by mouth in the morning and take 1500 mg by mouth in the evening     metoprolol succinate (TOPROL-XL) 50 MG 24  hr tablet Take 50 mg by mouth daily. Take with or immediately following a meal.     oxyCODONE (ROXICODONE) 5 MG immediate release tablet Take 1 tablet (5 mg total) by mouth every 6 (six) hours as needed for severe pain. 15 tablet 0   oxyCODONE-acetaminophen (PERCOCET) 5-325 MG tablet Take 1 tablet by mouth every 8 (eight) hours as needed for severe pain. 6 tablet 0   pravastatin (PRAVACHOL) 40 MG tablet Take 40 mg by mouth daily.     silver sulfADIAZINE (SILVADENE) 1 % cream Apply 1 Application topically daily. 50 g 0   sitaGLIPtin (JANUVIA) 100 MG tablet Take 100 mg by mouth daily.     No current facility-administered medications for this visit.    Review of Systems  Constitutional:  Negative for appetite change, chills, fatigue and fever.  HENT:   Negative for hearing loss and voice change.   Eyes:  Negative for eye problems.  Respiratory:  Negative for chest tightness and cough.   Cardiovascular:  Negative for chest pain.  Gastrointestinal:  Negative for abdominal distention, abdominal pain and blood in stool.  Endocrine: Negative for hot flashes.  Genitourinary:  Negative for difficulty urinating and frequency.   Musculoskeletal:  Negative for arthralgias.  Skin:  Negative for itching and rash.  Neurological:  Negative for extremity weakness.  Hematological:  Negative for adenopathy.  Psychiatric/Behavioral:  Negative for confusion.      PHYSICAL EXAMINATION: ECOG PERFORMANCE STATUS: 1 - Symptomatic but completely ambulatory  Vitals:   06/23/23 1344  BP: (!) 154/72  Pulse: 77  Temp: 97.7 F (36.5 C)  SpO2: 100%   Filed Weights   06/23/23 1344  Weight: 204 lb 8 oz (92.8 kg)    Physical Exam Constitutional:       General: She is not in acute distress.    Appearance: She is not diaphoretic.  HENT:     Head: Normocephalic.  Eyes:     General: No scleral icterus. Cardiovascular:     Rate and Rhythm: Normal rate.  Pulmonary:     Effort: Pulmonary effort is normal. No respiratory distress.     Breath sounds: No wheezing.  Abdominal:     General: There is no distension.     Palpations: Abdomen is soft.  Musculoskeletal:        General: Normal range of motion.     Cervical back: Normal range of motion and neck supple.  Skin:    General: Skin is warm and dry.     Findings: No erythema.  Neurological:     Mental Status: She is alert and oriented to person, place, and time.     Cranial Nerves: No cranial nerve deficit.     Motor: No abnormal muscle tone.     Coordination: Coordination normal.  Psychiatric:        Mood and Affect: Mood and affect normal.   Patient is status post right breast lumpectomy, with healing scar,  + hyperpigmentation and mild right breast inferior skin peeling- post radiation changes  LABORATORY DATA:  I have reviewed the data as listed    Latest Ref Rng & Units 06/08/2023    1:54 PM 05/25/2023   10:36 AM 05/11/2023    3:14 PM  CBC  WBC 4.0 - 10.5 K/uL 4.5  5.2  6.4   Hemoglobin 12.0 - 15.0 g/dL 96.2  95.2  84.1   Hematocrit 36.0 - 46.0 % 40.6  41.6  42.1   Platelets 150 - 400 K/uL  225  237  238       Latest Ref Rng & Units 04/05/2023    9:42 AM 12/23/2022   10:32 AM  CMP  Glucose 70 - 99 mg/dL 098  119   BUN 8 - 23 mg/dL 17  17   Creatinine 1.47 - 1.00 mg/dL 8.29  5.62   Sodium 130 - 145 mmol/L 136  137   Potassium 3.5 - 5.1 mmol/L 5.0  4.0   Chloride 98 - 111 mmol/L 103  100   CO2 22 - 32 mmol/L 25  27   Calcium 8.9 - 10.3 mg/dL 9.2  9.5   Total Protein 6.5 - 8.1 g/dL  7.4   Total Bilirubin 0.3 - 1.2 mg/dL  0.9   Alkaline Phos 38 - 126 U/L  56   AST 15 - 41 U/L  22   ALT 0 - 44 U/L  20      RADIOGRAPHIC STUDIES: I have personally reviewed the  radiological images as listed and agreed with the findings in the report. No results found.

## 2023-06-23 NOTE — Assessment & Plan Note (Signed)
Encourage oral hydration and avoid nephrotoxins.   

## 2023-06-23 NOTE — Assessment & Plan Note (Signed)
Recommend calcium and vitamin D supplementation.  Obtain baseline DEXA

## 2023-07-08 ENCOUNTER — Ambulatory Visit
Admission: RE | Admit: 2023-07-08 | Discharge: 2023-07-08 | Disposition: A | Discharge status: Home / Self Care | Payer: Medicare Other | Source: Ambulatory Visit | Attending: Radiation Oncology | Admitting: Radiation Oncology

## 2023-07-08 VITALS — BP 129/64 | HR 68 | Temp 97.0°F | Resp 14 | Ht 64.0 in | Wt 203.0 lb

## 2023-07-08 DIAGNOSIS — C50919 Malignant neoplasm of unspecified site of unspecified female breast: Secondary | ICD-10-CM | POA: Diagnosis present

## 2023-07-08 NOTE — Progress Notes (Signed)
Radiation Oncology Follow up Note  Name: Emma Torres   Date:   07/08/2023 MRN:  [id number] DOB: January 25, 1946    This 77 y.o. female presents to the clinic today for 1 month follow-up status post whole breast radiation to her right breast for stage T2 N0 ER/PR positive invasive mammary carcinoma with lobular features.  REFERRING PROVIDER: Gracelyn Nurse, MD  HPI: Patient is a 77 year old female now out 1 month having completed whole breast radiation to her right breast for T2 ER/PR positive invasive mammary carcinoma with lobular features.  Seen today in follow-up she is doing well specifically denies breast tenderness cough or bone pain her major complaint is hot flashes secondary to her endocrine therapy.  I have suggested some vitamin D supplements..  She is on Arimidex.  COMPLICATIONS OF TREATMENT: none  FOLLOW UP COMPLIANCE: keeps appointments   PHYSICAL EXAM:  BP 129/64   Pulse 68   Temp (!) 97 F (36.1 C)   Resp 14   Ht 5\' 4"  (1.626 m)   Wt 203 lb (92.1 kg)   BMI 34.84 kg/m  Lungs are clear to A&P cardiac examination essentially unremarkable with regular rate and rhythm. No dominant mass or nodularity is noted in either breast in 2 positions examined. Incision is well-healed. No axillary or supraclavicular adenopathy is appreciated. Cosmetic result is excellent.  Well-developed well-nourished patient in NAD. HEENT reveals PERLA, EOMI, discs not visualized.  Oral cavity is clear. No oral mucosal lesions are identified. Neck is clear without evidence of cervical or supraclavicular adenopathy. Lungs are clear to A&P. Cardiac examination is essentially unremarkable with regular rate and rhythm without murmur rub or thrill. Abdomen is benign with no organomegaly or masses noted. Motor sensory and DTR levels are equal and symmetric in the upper and lower extremities. Cranial nerves II through XII are grossly intact. Proprioception is intact. No peripheral adenopathy or edema is  identified. No motor or sensory levels are noted. Crude visual fields are within normal range.  RADIOLOGY RESULTS: No current films for review  PLAN: Present time patient is doing well 1 month out from whole breast radiation and pleased with her overall progress.  Of asked to see her back in 6 months for follow-up.  She continues on Arimidex.  She will try some vitamin D supplements to curtail some of the hot flashes.  I would like to take this opportunity to thank you for allowing me to participate in the care of your patient.Carmina Miller, MD

## 2023-07-20 ENCOUNTER — Encounter: Payer: Self-pay | Admitting: Oncology

## 2023-07-21 ENCOUNTER — Other Ambulatory Visit: Payer: Self-pay | Admitting: Oncology

## 2023-07-21 MED ORDER — VENLAFAXINE HCL 37.5 MG PO TABS: 37.5000 mg | ORAL_TABLET | Freq: Every day | ORAL | 2 refills | Status: DC

## 2023-08-12 ENCOUNTER — Other Ambulatory Visit: Payer: Self-pay | Admitting: Oncology

## 2023-09-01 ENCOUNTER — Ambulatory Visit
Admission: RE | Admit: 2023-09-01 | Discharge: 2023-09-01 | Disposition: A | Discharge status: Home / Self Care | Payer: Medicare Other | Source: Ambulatory Visit | Attending: Oncology | Admitting: Oncology

## 2023-09-01 DIAGNOSIS — Z780 Asymptomatic menopausal state: Secondary | ICD-10-CM | POA: Insufficient documentation

## 2023-09-01 DIAGNOSIS — Z7984 Long term (current) use of oral hypoglycemic drugs: Secondary | ICD-10-CM | POA: Insufficient documentation

## 2023-09-01 DIAGNOSIS — E119 Type 2 diabetes mellitus without complications: Secondary | ICD-10-CM | POA: Diagnosis not present

## 2023-09-01 DIAGNOSIS — Z79811 Long term (current) use of aromatase inhibitors: Secondary | ICD-10-CM | POA: Insufficient documentation

## 2023-09-01 DIAGNOSIS — Z923 Personal history of irradiation: Secondary | ICD-10-CM | POA: Insufficient documentation

## 2023-09-01 DIAGNOSIS — C50919 Malignant neoplasm of unspecified site of unspecified female breast: Secondary | ICD-10-CM | POA: Insufficient documentation

## 2023-09-09 ENCOUNTER — Other Ambulatory Visit: Payer: Self-pay | Admitting: Oncology

## 2023-09-20 ENCOUNTER — Encounter: Payer: Self-pay | Admitting: Oncology

## 2023-09-20 ENCOUNTER — Inpatient Hospital Stay: Payer: Medicare Other | Admitting: Oncology

## 2023-09-20 ENCOUNTER — Inpatient Hospital Stay: Payer: Medicare Other | Attending: Oncology

## 2023-09-20 VITALS — BP 156/64 | HR 68 | Temp 97.1°F | Resp 18 | Wt 200.0 lb

## 2023-09-20 DIAGNOSIS — C50911 Malignant neoplasm of unspecified site of right female breast: Secondary | ICD-10-CM | POA: Insufficient documentation

## 2023-09-20 DIAGNOSIS — Z79811 Long term (current) use of aromatase inhibitors: Secondary | ICD-10-CM

## 2023-09-20 DIAGNOSIS — Z170 Estrogen receptor positive status [ER+]: Secondary | ICD-10-CM | POA: Diagnosis not present

## 2023-09-20 DIAGNOSIS — N1832 Chronic kidney disease, stage 3b: Secondary | ICD-10-CM | POA: Insufficient documentation

## 2023-09-20 DIAGNOSIS — C50919 Malignant neoplasm of unspecified site of unspecified female breast: Secondary | ICD-10-CM | POA: Diagnosis not present

## 2023-09-20 DIAGNOSIS — N1831 Chronic kidney disease, stage 3a: Secondary | ICD-10-CM | POA: Diagnosis not present

## 2023-09-20 DIAGNOSIS — Z923 Personal history of irradiation: Secondary | ICD-10-CM | POA: Insufficient documentation

## 2023-09-20 DIAGNOSIS — Z79899 Other long term (current) drug therapy: Secondary | ICD-10-CM | POA: Diagnosis not present

## 2023-09-20 LAB — CMP (CANCER CENTER ONLY)
ALT: 18 U/L (ref 0–44)
AST: 21 U/L (ref 15–41)
Albumin: 4.2 g/dL (ref 3.5–5.0)
Alkaline Phosphatase: 51 U/L (ref 38–126)
Anion gap: 10 (ref 5–15)
BUN: 21 mg/dL (ref 8–23)
CO2: 25 mmol/L (ref 22–32)
Calcium: 9.3 mg/dL (ref 8.9–10.3)
Chloride: 100 mmol/L (ref 98–111)
Creatinine: 1.06 mg/dL — ABNORMAL HIGH (ref 0.44–1.00)
GFR, Estimated: 54 mL/min — ABNORMAL LOW (ref >60)
Glucose, Bld: 140 mg/dL — ABNORMAL HIGH (ref 70–99)
Potassium: 3.8 mmol/L (ref 3.5–5.1)
Sodium: 135 mmol/L (ref 135–145)
Total Bilirubin: 1 mg/dL (ref 0.3–1.2)
Total Protein: 7.1 g/dL (ref 6.5–8.1)

## 2023-09-20 LAB — CBC WITH DIFFERENTIAL (CANCER CENTER ONLY)
Abs Immature Granulocytes: 0.02 10*3/uL (ref 0.00–0.07)
Basophils Absolute: 0 10*3/uL (ref 0.0–0.1)
Basophils Relative: 0 %
Eosinophils Absolute: 0.1 10*3/uL (ref 0.0–0.5)
Eosinophils Relative: 1 %
HCT: 42.5 % (ref 36.0–46.0)
Hemoglobin: 13.5 g/dL (ref 12.0–15.0)
Immature Granulocytes: 0 %
Lymphocytes Relative: 30 %
Lymphs Abs: 1.4 10*3/uL (ref 0.7–4.0)
MCH: 27.4 pg (ref 26.0–34.0)
MCHC: 31.8 g/dL (ref 30.0–36.0)
MCV: 86.2 fL (ref 80.0–100.0)
Monocytes Absolute: 0.5 10*3/uL (ref 0.1–1.0)
Monocytes Relative: 9 %
Neutro Abs: 2.8 10*3/uL (ref 1.7–7.7)
Neutrophils Relative %: 60 %
Platelet Count: 226 10*3/uL (ref 150–400)
RBC: 4.93 MIL/uL (ref 3.87–5.11)
RDW: 14.6 % (ref 11.5–15.5)
WBC Count: 4.8 10*3/uL (ref 4.0–10.5)
nRBC: 0 % (ref 0.0–0.2)

## 2023-09-20 MED ORDER — ANASTROZOLE 1 MG PO TABS: 1.0000 mg | ORAL_TABLET | Freq: Every day | ORAL | 1 refills | Status: DC

## 2023-09-20 NOTE — Assessment & Plan Note (Addendum)
Recommend calcium and vitamin D supplementation.  09/01/2023 DEXA showed normal bone density.

## 2023-09-20 NOTE — Progress Notes (Signed)
Hematology/Oncology Consult Note [phone] [phone]  CHIEF COMPLAINTS/PURPOSE OF CONSULTATION:  Right breast invasive mammary carcinoma.   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  Positive posterior margin. She underwent re-excision, no residual disease.  Oncotype DX  on both mass were low, no chemotherapy benefit  S/p adjuvant radiation.  Continue Arimidex 1mg  daily.  Refill prescription was sent. Continue Effexor 37.5 mg daily for hot flash  Annual mammogram surveillance - next due Nov 2024 And also to set up mammogram ordered.  Patient prefers to get mammogram set up through surgeons office.  CKD (chronic kidney disease) stage 3, GFR 30-59 ml/min (HCC) Encourage oral hydration and avoid nephrotoxins.    Aromatase inhibitor use Recommend calcium and vitamin D supplementation.  09/01/2023 DEXA showed normal bone density.   . Orders Placed This Encounter  Procedures   CBC with Differential (Cancer Center Only)    Standing Status:   Future    Standing Expiration Date:   09/19/2024   CMP (Cancer Center only)    Standing Status:   Future    Standing Expiration Date:   09/19/2024   Follow-up 6 months  Rickard Patience, MD, PhD [facility] Hematology Oncology 09/20/2023   HISTORY OF PRESENTING ILLNESS:  Emma Torres 77 y.o. female presents to establish care for Right breast invasive mammary carcinoma I have reviewed her chart and materials related to her cancer extensively and collaborated history with the patient. Summary of oncologic history is as follows: Oncology History  Invasive carcinoma of breast (HCC)  10/24/2021 Mammogram   Bilateral diagnostic mammogram 1.  Stable appearance of probably benign left breast mass. 2.  No findings of malignancy in either breast.   11/09/2022 Mammogram   Bilateral diagnostic mammogram and [place] bilateral breast  1. There is an area  of possible subtle architectural distortion in the RIGHT upper breast at posterior depth. Recommend stereotactic guided biopsy for definitive characterization. 2. There is a 14 mm mass in the LEFT breast at 3 o'clock 2 cm rom the nipple. This likely reflects a complicated cyst. Given hypoechoic appearance on today's exam, recommend ultrasound-guidedaspiration for definitive characterization. 3. Stable LEFT breast mass at 12 o'clock for greater than 2 years, consistent with a benign etiology.   12/23/2022 Initial Diagnosis   Invasive carcinoma of breast  Patient gets mammogram surveillance for left breast mass which has been stable. Recent mammogram showed right breast distortion.   11/30/2022 Right breast stereotactic biopsy showed Invasive mammary carcinoma with mixed lobular and ductal features, Grade 2, no LVI, ER 90%+, PR 90%+, HER2 - [facility] 0]   Menarche at age of 37 First live birth at age of 85 OCP use: denies History of hysterectomy: yes Menopausal status: postmenopausal History of HRT use: 2 years History of chest radiation: denies Number of previous breast biopsies:  right breast.    12/23/2022 Cancer Staging   Staging form: Breast, AJCC 8th Edition - Clinical: Stage Unknown (cTX, cN0, cM0, G2, ER+, PR+, HER2-) - Signed by Rickard Patience, MD on 12/23/2022 Histologic grading system: 3 grade system    Genetic Testing   No pathogenic variants identified on the [facility] Multi-Cancer+RNA panel. 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 The report date is 01/30/2023.  The Multi-Cancer + RNA Panel offered by [facility] includes sequencing and/or deletion/duplication analysis of the following 70 genes:  AIP*, ALK, APC*, ATM*, AXIN2*, BAP1*, BARD1*, BLM*, BMPR1A*, BRCA1*, BRCA2*, BRIP1*, CDC73*, CDH1*,  CDK4, CDKN1B*, CDKN2A, CHEK2*, CTNNA1*, DICER1*, EPCAM, EGFR, FH*, FLCN*, GREM1, HOXB13, KIT, LZTR1, MAX*, MBD4, MEN1*, MET, MITF, MLH1*, MSH2*, MSH3*, MSH6*, MUTYH*, NF1*, NF2*, NTHL1*,  PALB2*, PDGFRA, PMS2*, POLD1*, POLE*, POT1*, PRKAR1A*, PTCH1*, PTEN*, RAD51C*, RAD51D*, RB1*, RET, SDHA*, SDHAF2*, SDHB*, SDHC*, SDHD*, SMAD4*, SMARCA4*, SMARCB1*, SMARCE1*, STK11*, SUFU*, TMEM127*, TP53*, TSC1*, TSC2*, VHL*. RNA analysis is performed for * genes.   12/29/2022 Imaging   Bilateral MRI breast w wo contrast  1. Indeterminate 0.6 cm mass involving the UPPER INNER QUADRANT of the RIGHT breast at anterior depth. 2. Indeterminate 1.7 cm linear non-mass enhancement involving the LOWER OUTER QUADRANT of the LEFT breast at middle to posterior depth. 3. Indeterminate 1.8 cm mass involving the LOWER OUTER QUADRANT of the LEFT breast at far posterior depth. 4. No pathologic lymphadenopathy. 5. 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   01/25/2023 Procedure   Patient underwent MRI biopsy of right breast upper inner quadrant anterior depth as well as non-mass enhancement in the lower outer quadrant left breast and indeterminate mass involving lower outer quadrant of the left breast.  Pathology showed 1. Breast, right, needle core biopsy, upper anterior INVASIVE MODERATELY DIFFERENTIATED DUCTAL CARCINOMA WITH LOBULAR FEATURES, GRADE 2 (3+2+1) FOCAL DUCTAL CARCINOMA IN SITU, INTERMEDIATE NUCLEAR GRADE, SOLID TYPE WITHOUT NECROSIS TUBULE FORMATION: SCORE 3 NUCLEAR PLEOMORPHISM: SCORE 2 MITOTIC COUNT: SCORE 1 TOTAL SCORE: 6 OF 9 OVERALL GRADE: GRADE 2 RARE MICROCALCIFICATION PRESENT WITHIN BENIGN DUCT NEGATIVE FOR ANGIOLYMPHATIC INVASION TUMOR MEASURES 4 MM IN GREATEST LINEAR EXTENT ER 95% positive, PR 90% positive, Ki-67 5%.  HER2 negative [IHC1+].  2. Breast, left, needle core biopsy, lateral BENIGN BREAST WITH FIBROCYSTIC CHANGES INCLUDING STROMAL FIBROSIS, APOCRINE METAPLASIA, ADENOSIS AND USUAL DUCT HYPERPLASIA MICROCALCIFICATIONS PRESENT WITHIN DUCT HYPERPLASIA AND ADENOSIS NEGATIVE FOR CARCINOMA  3. Breast, left,  needle core biopsy, LOQ CONSISTENT WITH BENIGN FIBROADENOMA ADJACENT FIBROCYSTIC CHANGES INCLUDING STROMAL FIBROSIS AND ADENOSIS RARE MICROCALCIFICATION WITHIN A FIBROADENOMA NEGATIVE FOR CARCINOMA    02/05/2023 Surgery   Patient underwent right breast lumpectomy of right anterior mass, right posterior mass and right medial margin reexcision with sentinel lymph node biopsy Pathology showed pT2 pN0   A.  BREAST, RIGHT, ANTERIOR MASS; EXCISION:- MULTIPLE FOCI (at least 6) OF INVASIVE DUCTAL CARCINOMA WITH LOBULAR FEATURES, LARGEST FOCI 6.2 MM; MARGINS NEGATIVE.  Oncotype DX recurrence score of this mass is 24, <1% chemotherapy benefit.]   B.  BREAST, RIGHT, POSTERIOR MASS; EXCISION: - MULTIFOCAL INVASIVE DUCTAL CARCINOMA WITH LOBULAR FEATURES, LARGEST FOCI 21 mm, LARGEST FOCI FOCALLY EXTENDS TO MEDIAL MARGIN (REEXCISED IN C), SEPARATE 1 MM FOCI EXTENDS TO INFERIOR MARGIN [Oncotype DX recurrence score of this mass is 12, no chemotherapy benefit.]  C.  BREAST, RIGHT, MEDIAL MARGIN, REEXCISION: - FOCI OF INVASIVE DUCTAL CARCINOMA WITH LOBULAR FEATURES 2.5 mm, NOT AT MARGIN.  D.  RIGHT SENTINEL LYMPH NODE; EXCISION: - NO EVIDENCE OF METASTATIC CARCINOMA IN 1 SENTINEL LYMPH NODE   TUMOR  Histologic Type: Invasive ductal carcinoma with lobular features.  Histologic Grade ([place] Histologic Score)       Glandular (Acinar)/Tubular Differentiation: 3 (of 3)       Nuclear Pleomorphism: 2 (of 3)       Mitotic Rate: 1 (77f 3)       Overall Grade: 2 (of 3)  Tumor Size: 6.2 mm  Tumor Focality: Multifocal  Ductal Carcinoma In Situ (DCIS): Not identified  Tumor Extent: N/A  Lymphatic and/or Vascular Invasion: Not identified  Treatment Effect in the Breast:  No known presurgical therapy   MARGINS  Margin Status for Invasive Carcinoma: All margins negative for invasive  carcinoma      Distance from closest margin: 1.6 mm       Specify closest margin: Posterior      03/02/2023 Cancer  Staging   Staging form: Breast, AJCC 8th Edition - Pathologic: Stage IA (pT2, pN0, cM0, G2, ER+, PR+, HER2-, Oncotype DX score: 12) - Signed by Rickard Patience, MD on 03/02/2023 Stage prefix: Initial diagnosis Multigene prognostic tests performed: Oncotype DX Recurrence score range: Greater than or equal to 11 Histologic grading system: 3 grade system   04/08/2023 Surgery   S/p re-excision by Dr. Stephens November. BREAST, RIGHT INFERIOR/ANTERIOR MARGIN, OF SUPERIOR LUMPECTOMY:  - Benign breast parenchyma with previous procedure-related changes  - Negative for carcinoma   B. BREAST, RIGHT INFERIOR/MIDDLE MARGIN, OF SUPERIOR LUMPECTOMY:  - Benign breast parenchyma with previous procedure-related changes  - Negative for carcinoma   C. BREAST, RIGHT INFERIOR/DEEP MARGIN, OF SUPERIOR LUMPECTOMY:  - Benign breast parenchyma with previous procedure-related changes  - Negative for carcinoma   D. BREAST, RIGHT INFERIOR MARGIN, OF INFERIOR LUMPECTOMY:  - Benign breast parenchyma with previous procedure-related changes  - Negative for carcinoma    05/11/2023 - 06/09/2023 Radiation Therapy   Adjuvant radiation to right breast   06/23/2023 -  Anti-estrogen oral therapy   Start on Arimidex 1 mg daily.    Patient has positive family history of breast cancer and uterine cancer.    INTERVAL HISTORY Emma Torres is a 76 y.o. female who has above history reviewed by me today presents for follow up visit for right breast cancer.  Patient takes Arimidex 1 mg daily.  Overall she tolerates well with hot flashes manageable with Effexor 37.5 mg daily.  No new complaints today.  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)    Cough    chronic   Diverticulosis    Hypercholesterolemia    Hypertension    IBS (irritable bowel  syndrome)    Stage 3b chronic kidney disease (CKD) (HCC)     SURGICAL HISTORY: Past Surgical History:  Procedure Laterality Date   ABDOMINAL HYSTERECTOMY     BREAST BIOPSY Right 11/30/2022   MM RT BREAST BX W LOC DEV 1ST LESION IMAGE BX SPEC STEREO GUIDE 11/30/2022 [facility]   BREAST BIOPSY Right 12/24/2022   MM RT RADIO FREQUENCY TAG LOC MAMMO GUIDE 12/24/2022 [facility]   BREAST BIOPSY Right 02/02/2023   MM RT RADIO FREQUENCY TAG LOC MAMMO GUIDE 02/02/2023 [facility]   breast biospy Right 11/30/2022   rt stereo asymetry, coil clip, path pending   BREAST CYST ASPIRATION Left 11/30/2022   BREAST EXCISIONAL BIOPSY Right 2011   negative   BREAST LUMPECTOMY,RADIO FREQ LOCALIZER,AXILLARY SENTINEL LYMPH NODE BIOPSY Right 02/05/2023   Procedure: BREAST LUMPECTOMY,RADIO FREQ LOCALIZER,AXILLARY SENTINEL LYMPH NODE BIOPSY;  Surgeon: Sung Amabile, DO;  Location: [facility];  Service: General;  Laterality: Right;   CATARACT EXTRACTION W/PHACO Left 11/18/2017   Procedure: CATARACT EXTRACTION PHACO AND INTRAOCULAR LENS PLACEMENT (IOC);  Surgeon: Nevada Crane, MD;  Location: [facility];  Service: Ophthalmology;  Laterality: Left;  Lot # [id number] H [place]: 00:28.6 AP%: 10.8 CDE: 3.10    CATARACT EXTRACTION W/PHACO Right 01/13/2018   Procedure:  CATARACT EXTRACTION PHACO AND INTRAOCULAR LENS PLACEMENT (IOC);  Surgeon: Nevada Crane, MD;  Location: [facility];  Service: Ophthalmology;  Laterality: Right;  cassette lot #  [id number] H  [place]   00:29.4 AP%   9.6 CDE2.82   COLONOSCOPY     2005, 2009, 2014   COLONOSCOPY WITH PROPOFOL N/A 08/29/2018   Procedure: COLONOSCOPY WITH PROPOFOL;  Surgeon: Scot Jun, MD;  Location: [facility];  Service: Endoscopy;  Laterality: N/A;   RE-EXCISION OF BREAST CANCER,SUPERIOR MARGINS Right 04/08/2023   Procedure: RE-EXCISION OF RIGHT BREAST MARGINS X2;  Surgeon: Griselda Miner, MD;  Location: [facility];  Service: General;   Laterality: Right;   TONSILLECTOMY     TUBAL LIGATION     VAGINAL HYSTERECTOMY      SOCIAL HISTORY: Social History   Socioeconomic History   Marital status: Married    Spouse name: Homero Fellers   Number of children: 2   Years of education: Not on file   Highest education level: Not on file  Occupational History   Not on file  Tobacco Use   Smoking status: Never   Smokeless tobacco: Never  Vaping Use   Vaping status: Never Used  Substance and Sexual Activity   Alcohol use: No   Drug use: No   Sexual activity: Not on file  Other Topics Concern   Not on file  Social History Narrative   Not on file   Social Determinants of Health   Financial Resource Strain: Low Risk  (06/15/2023)   Received from [facility], [facility]   Overall Financial Resource Strain (CARDIA)    Difficulty of Paying Living Expenses: Not hard at all  Food Insecurity: No Food Insecurity (06/15/2023)   Received from [facility], [facility]   Hunger Vital Sign    Worried About Running Out of Food in the Last Year: Never true    Ran Out of Food in the Last Year: Never true  Transportation Needs: No Transportation Needs (06/15/2023)   Received from [facility], [facility]   [facility] - Transportation    In the past 12 months, has lack of transportation kept you from medical appointments or from getting medications?: No    Lack of Transportation (Non-Medical): No  Physical Activity: Not on file  Stress: Not on file  Social Connections: Not on file  Intimate Partner Violence: Not on file    FAMILY HISTORY: Family History  Problem Relation Age of Onset   Prostate cancer Brother 72   Multiple myeloma Brother 36   Breast cancer Maternal Aunt        dx 62s   Breast cancer Maternal Aunt        dx 24s   Uterine cancer Maternal Grandmother        dx 34s   Breast cancer Cousin        dx 54s     ALLERGIES:  is allergic to tape.  MEDICATIONS:  Current Outpatient Medications  Medication Sig Dispense Refill   acetaminophen (TYLENOL) 325 MG tablet Take 650 mg by mouth every 6 (six) hours as needed.     anastrozole (ARIMIDEX) 1 MG tablet Take 1 tablet (1 mg total) by mouth daily. 30 tablet 3   Biotin w/ Vitamins C & E (HAIR/SKIN/NAILS PO) Take 1 tablet by mouth daily.     Blood Glucose Monitoring Suppl (ONE TOUCH ULTRA 2) w/Device KIT See admin instructions.  Calcium Carbonate-Vitamin D (CALTRATE 600+D PO) Take 1 tablet by mouth daily.     cyanocobalamin (VITAMIN B12) 1000 MCG tablet Take 1,000 mcg by mouth daily.     glipiZIDE (GLUCOTROL) 10 MG tablet Take 20 mg by mouth 2 (two) times daily before a meal.      glucose blood (ONETOUCH ULTRA) test strip Use once daily E11.9     ibuprofen (ADVIL,MOTRIN) 200 MG tablet Take 200 mg by mouth every 6 (six) hours as needed for headache or moderate pain.     Lancets Misc. (UNISTIK 2 NORMAL) MISC Use 1 each once daily Use as instructed. One Touch Ultra lancets E11.9     lidocaine-prilocaine (EMLA) cream Apply topically.     lisinopril-hydrochlorothiazide (PRINZIDE,ZESTORETIC) 20-12.5 MG tablet Take 1 tablet by mouth daily.     Magnesium 250 MG TABS Take 250 mg by mouth daily.     metFORMIN (GLUCOPHAGE) 1000 MG tablet Take 1,000-1,500 mg by mouth See admin instructions. Take 1000 mg by mouth in the morning and take 1500 mg by mouth in the evening     metoprolol succinate (TOPROL-XL) 50 MG 24 hr tablet Take 50 mg by mouth daily. Take with or immediately following a meal.     oxyCODONE (ROXICODONE) 5 MG immediate release tablet Take 1 tablet (5 mg total) by mouth every 6 (six) hours as needed for severe pain. 15 tablet 0   oxyCODONE-acetaminophen (PERCOCET) 5-325 MG tablet Take 1 tablet by mouth every 8 (eight) hours as needed for severe pain. 6 tablet 0   pravastatin (PRAVACHOL) 40 MG tablet Take 40 mg by mouth daily.     silver sulfADIAZINE  (SILVADENE) 1 % cream Apply 1 Application topically daily. 50 g 0   sitaGLIPtin (JANUVIA) 100 MG tablet Take 100 mg by mouth daily.     venlafaxine (EFFEXOR) 37.5 MG tablet TAKE 1 TABLET BY MOUTH EVERY DAY 90 tablet 1   No current facility-administered medications for this visit.    Review of Systems  Constitutional:  Negative for appetite change, chills, fatigue and fever.  HENT:   Negative for hearing loss and voice change.   Eyes:  Negative for eye problems.  Respiratory:  Negative for chest tightness and cough.   Cardiovascular:  Negative for chest pain.  Gastrointestinal:  Negative for abdominal distention, abdominal pain and blood in stool.  Endocrine: Negative for hot flashes.  Genitourinary:  Negative for difficulty urinating and frequency.   Musculoskeletal:  Negative for arthralgias.  Skin:  Negative for itching and rash.  Neurological:  Negative for extremity weakness.  Hematological:  Negative for adenopathy.  Psychiatric/Behavioral:  Negative for confusion.      PHYSICAL EXAMINATION: ECOG PERFORMANCE STATUS: 1 - Symptomatic but completely ambulatory  Vitals:   09/20/23 1058  BP: (!) 156/64  Pulse: 68  Resp: 18  Temp: (!) 97.1 F (36.2 C)  SpO2: 98%   Filed Weights   09/20/23 1058  Weight: 200 lb (90.7 kg)    Physical Exam Constitutional:      General: She is not in acute distress.    Appearance: She is not diaphoretic.  HENT:     Head: Normocephalic.  Eyes:     General: No scleral icterus. Cardiovascular:     Rate and Rhythm: Normal rate.  Pulmonary:     Effort: Pulmonary effort is normal. No respiratory distress.     Breath sounds: No wheezing.  Abdominal:     General: There is no distension.     Palpations:  Abdomen is soft.  Musculoskeletal:        General: Normal range of motion.     Cervical back: Normal range of motion and neck supple.  Skin:    General: Skin is warm and dry.     Findings: No erythema.  Neurological:     Mental Status:  She is alert and oriented to person, place, and time.     Cranial Nerves: No cranial nerve deficit.     Motor: No abnormal muscle tone.     Coordination: Coordination normal.  Psychiatric:        Mood and Affect: Mood and affect normal.    LABORATORY DATA:  I have reviewed the data as listed    Latest Ref Rng & Units 09/20/2023   10:33 AM 06/08/2023    1:54 PM 05/25/2023   10:36 AM  CBC  WBC 4.0 - 10.5 K/uL 4.8  4.5  5.2   Hemoglobin 12.0 - 15.0 g/dL 16.1  09.6  04.5   Hematocrit 36.0 - 46.0 % 42.5  40.6  41.6   Platelets 150 - 400 K/uL 226  225  237       Latest Ref Rng & Units 09/20/2023   10:33 AM 04/05/2023    9:42 AM 12/23/2022   10:32 AM  CMP  Glucose 70 - 99 mg/dL 409  811  914   BUN 8 - 23 mg/dL 21  17  17    Creatinine 0.44 - 1.00 mg/dL 7.82  9.56  2.13   Sodium 135 - 145 mmol/L 135  136  137   Potassium 3.5 - 5.1 mmol/L 3.8  5.0  4.0   Chloride 98 - 111 mmol/L 100  103  100   CO2 22 - 32 mmol/L 25  25  27    Calcium 8.9 - 10.3 mg/dL 9.3  9.2  9.5   Total Protein 6.5 - 8.1 g/dL 7.1   7.4   Total Bilirubin 0.3 - 1.2 mg/dL 1.0   0.9   Alkaline Phos 38 - 126 U/L 51   56   AST 15 - 41 U/L 21   22   ALT 0 - 44 U/L 18   20      RADIOGRAPHIC STUDIES: I have personally reviewed the radiological images as listed and agreed with the findings in the report. DG Bone Density  Result Date: 09/01/2023 EXAM: DUAL X-RAY ABSORPTIOMETRY (DXA) FOR BONE MINERAL DENSITY IMPRESSION: Your patient Amielia Ramson completed a BMD test on 09/01/2023 using the [employer] DXA System (software version: 14.10) manufactured by [employer]. The following summarizes the results of our evaluation. Technologist: [facility] PATIENT BIOGRAPHICAL: Name: Emma Torres, Emma Torres Patient ID: [id number] Birth Date: 05/11/1946 Height: 64.0 in. Gender: Female Exam Date: 09/01/2023 Weight: 198.1 lbs. Indications: Advanced Age, Diabetic, History of Breast Cancer, History of Radiation, Hysterectomy, Postmenopausal  Fractures: Treatments: Arimidex, Calcium, Glipizide, Metformin, Vitamin D DENSITOMETRY RESULTS: Site         Region     Measured Date Measured Age WHO Classification Young Adult T-score BMD         %Change vs. Previous Significant Change (*) DualFemur Neck Right 09/01/2021 78.9 Normal -0.5 0.975 g/cm2 Left Forearm Radius 33% 09/01/2023 78.9 Normal 0.7 0.933 g/cm2 ASSESSMENT: The BMD measured at Femur Neck Right is 0.975 g/cm2 with a T-score of -0.5. This patient is considered normal according to World Health Organization [facility]) criteria. The scan quality is good. Lumbar spine was not utilized due to advanced degenerative changes.  World Science writer [facility]) criteria for post-menopausal, Caucasian Women: Normal:                   T-score at or above -1 SD Osteopenia/low bone mass: T-score between -1 and -2.5 SD Osteoporosis:             T-score at or below -2.5 SD RECOMMENDATIONS: 1. All patients should optimize calcium and vitamin D intake. 2. Consider FDA-approved medical therapies in postmenopausal women and men aged 63 years and older, based on the following: a. A hip or vertebral(clinical or morphometric) fracture b. T-score < -2.5 at the femoral neck or spine after appropriate evaluation to exclude secondary causes c. Low bone mass (T-score between -1.0 and -2.5 at the femoral neck or spine) and a 10-year probability of a hip fracture > 3% or a 10-year probability of a major osteoporosis-related fracture > 20% based on the [place]-adapted WHO algorithm 3. Clinician judgment and/or patient preferences may indicate treatment for people with 10-year fracture probabilities above or below these levels FOLLOW-UP: People with diagnosed cases of osteoporosis or at high risk for fracture should have regular bone mineral density tests. For patients eligible for Medicare, routine testing is allowed once every 2 years. The testing frequency can be increased to one year for patients who have rapidly progressing disease, those  who are receiving or discontinuing medical therapy to restore bone mass, or have additional risk factors. I have reviewed this report, and agree with the above findings. [facility] Electronically Signed   By: Annia Belt M.D.   On: 09/01/2023 09:32

## 2023-09-20 NOTE — Assessment & Plan Note (Signed)
Encourage oral hydration and avoid nephrotoxins.   

## 2023-09-20 NOTE — Assessment & Plan Note (Addendum)
Right breast invasive carcinoma. 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  Positive posterior margin. She underwent re-excision, no residual disease.  Oncotype DX  on both mass were low, no chemotherapy benefit  S/p adjuvant radiation.  Continue Arimidex 1mg  daily.  Refill prescription was sent. Continue Effexor 37.5 mg daily for hot flash  Annual mammogram surveillance - next due Nov 2024 And also to set up mammogram ordered.  Patient prefers to get mammogram set up through surgeons office.

## 2023-10-26 ENCOUNTER — Other Ambulatory Visit: Payer: Self-pay | Admitting: Internal Medicine

## 2023-10-26 DIAGNOSIS — Z853 Personal history of malignant neoplasm of breast: Secondary | ICD-10-CM

## 2023-11-24 ENCOUNTER — Ambulatory Visit
Admission: RE | Admit: 2023-11-24 | Discharge: 2023-11-24 | Disposition: A | Discharge status: Home / Self Care | Payer: Medicare Other | Source: Ambulatory Visit | Attending: Internal Medicine

## 2023-11-24 DIAGNOSIS — Z853 Personal history of malignant neoplasm of breast: Secondary | ICD-10-CM

## 2023-11-24 HISTORY — DX: Personal history of irradiation: Z92.3

## 2024-01-19 ENCOUNTER — Ambulatory Visit
Admission: RE | Admit: 2024-01-19 | Discharge: 2024-01-19 | Disposition: A | Discharge status: Home / Self Care | Payer: Medicare Other | Source: Ambulatory Visit | Attending: Radiation Oncology | Admitting: Radiation Oncology

## 2024-01-19 VITALS — BP 137/63 | HR 66 | Temp 98.7°F | Resp 16 | Ht 64.0 in | Wt 203.0 lb

## 2024-01-19 DIAGNOSIS — C50411 Malignant neoplasm of upper-outer quadrant of right female breast: Secondary | ICD-10-CM

## 2024-01-19 NOTE — Progress Notes (Signed)
Radiation Oncology Follow up Note  Name: Emma Torres   Date:   01/19/2024 MRN:  [id number] DOB: Aug 20, 1946    This 78 y.o. 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  Seen today in routine follow-up she is doing well.  She specifically denies breast tenderness cough or bone pain..  She is currently on Arimidex tolerating it well without side effect.  She had mammograms back in December which I have reviewed were BI-RADS 2 benign  COMPLICATIONS OF TREATMENT: none  FOLLOW UP COMPLIANCE: keeps appointments   PHYSICAL EXAM:  BP 137/63   Pulse 66   Temp 98.7 F (37.1 C) (Tympanic)   Resp 16   Ht 5\' 4"  (1.626 m)   Wt 203 lb (92.1 kg)   BMI 34.84 kg/m  Lungs are clear to A&P cardiac examination essentially unremarkable with regular rate and rhythm. No dominant mass or nodularity is noted in either breast in 2 positions examined. Incision is well-healed. No axillary or supraclavicular adenopathy is appreciated. Cosmetic result is excellent.  Well-developed well-nourished patient in NAD. HEENT reveals PERLA, EOMI, discs not visualized.  Oral cavity is clear. No oral mucosal lesions are identified. Neck is clear without evidence of cervical or supraclavicular adenopathy. Lungs are clear to A&P. Cardiac examination is essentially unremarkable with regular rate and rhythm without murmur rub or thrill. Abdomen is benign with no organomegaly or masses noted. Motor sensory and DTR levels are equal and symmetric in the upper and lower extremities. Cranial nerves II through XII are grossly intact. Proprioception is intact.  No peripheral adenopathy or edema is identified. No motor or sensory levels are noted. Crude visual fields are within normal range.  RADIOLOGY RESULTS: Mammograms reviewed compatible with above-stated findings  PLAN: Present time patient is now out 7 months from whole breast radiation with no evidence of disease.  She is currently on Arimidex tolerating it well without side effect.  At this time based on her advanced age and mobility have discontinue follow-up care.  She continues follow-up care with her surgeon and medical oncology.  I be happy to reevaluate her at any time should that be indicated.  I would like to take this opportunity to thank you for allowing me to participate in the care of your patient.Carmina Miller, MD

## 2024-02-03 ENCOUNTER — Encounter: Payer: Self-pay | Admitting: Gastroenterology

## 2024-02-04 ENCOUNTER — Ambulatory Visit: Payer: Medicare Other | Admitting: Certified Registered"

## 2024-02-04 ENCOUNTER — Ambulatory Visit
Admission: RE | Admit: 2024-02-04 | Discharge: 2024-02-04 | Disposition: A | Discharge status: Home / Self Care | Payer: Medicare Other | Attending: Gastroenterology | Admitting: Gastroenterology

## 2024-02-04 ENCOUNTER — Encounter: Payer: Self-pay | Admitting: Gastroenterology

## 2024-02-04 ENCOUNTER — Encounter
Admission: RE | Disposition: A | Discharge status: Home / Self Care | Payer: Self-pay | Source: Home / Self Care | Attending: Gastroenterology

## 2024-02-04 ENCOUNTER — Other Ambulatory Visit: Payer: Self-pay

## 2024-02-04 DIAGNOSIS — K641 Second degree hemorrhoids: Secondary | ICD-10-CM | POA: Diagnosis not present

## 2024-02-04 DIAGNOSIS — Z83719 Family history of colon polyps, unspecified: Secondary | ICD-10-CM | POA: Insufficient documentation

## 2024-02-04 DIAGNOSIS — Z90710 Acquired absence of both cervix and uterus: Secondary | ICD-10-CM | POA: Insufficient documentation

## 2024-02-04 DIAGNOSIS — N1832 Chronic kidney disease, stage 3b: Secondary | ICD-10-CM | POA: Insufficient documentation

## 2024-02-04 DIAGNOSIS — K5730 Diverticulosis of large intestine without perforation or abscess without bleeding: Secondary | ICD-10-CM | POA: Insufficient documentation

## 2024-02-04 DIAGNOSIS — I129 Hypertensive chronic kidney disease with stage 1 through stage 4 chronic kidney disease, or unspecified chronic kidney disease: Secondary | ICD-10-CM | POA: Insufficient documentation

## 2024-02-04 DIAGNOSIS — Z7984 Long term (current) use of oral hypoglycemic drugs: Secondary | ICD-10-CM | POA: Insufficient documentation

## 2024-02-04 DIAGNOSIS — D128 Benign neoplasm of rectum: Secondary | ICD-10-CM | POA: Insufficient documentation

## 2024-02-04 DIAGNOSIS — Z1211 Encounter for screening for malignant neoplasm of colon: Secondary | ICD-10-CM | POA: Diagnosis present

## 2024-02-04 DIAGNOSIS — E1122 Type 2 diabetes mellitus with diabetic chronic kidney disease: Secondary | ICD-10-CM | POA: Diagnosis not present

## 2024-02-04 HISTORY — PX: COLONOSCOPY WITH PROPOFOL: SHX5780

## 2024-02-04 HISTORY — PX: POLYPECTOMY: SHX5525

## 2024-02-04 LAB — GLUCOSE, CAPILLARY: Glucose-Capillary: 130 mg/dL — ABNORMAL HIGH (ref 70–99)

## 2024-02-04 SURGERY — COLONOSCOPY WITH PROPOFOL
Anesthesia: General

## 2024-02-04 MED ORDER — PROPOFOL 10 MG/ML IV BOLUS
INTRAVENOUS | Status: DC | PRN
  Administered 2024-02-04: 60 mg via INTRAVENOUS

## 2024-02-04 MED ORDER — SODIUM CHLORIDE 0.9 % IV SOLN: INTRAVENOUS | Status: DC

## 2024-02-04 MED ORDER — PROPOFOL 500 MG/50ML IV EMUL
INTRAVENOUS | Status: DC | PRN
  Administered 2024-02-04: 165 ug/kg/min via INTRAVENOUS

## 2024-02-04 MED ORDER — LIDOCAINE HCL (CARDIAC) PF 100 MG/5ML IV SOSY
PREFILLED_SYRINGE | INTRAVENOUS | Status: DC | PRN
  Administered 2024-02-04: 100 mg via INTRAVENOUS

## 2024-02-04 NOTE — Op Note (Signed)
 [facility] Gastroenterology Patient Name: Emma Torres Procedure Date: 02/04/2024 8:49 AM MRN: [id number] Account #: [id number] Date of Birth: October 26, 1946 Admit Type: Outpatient Age: 78 Room: [facility] ENDO ROOM 1 Gender: Female Note Status: Finalized Instrument Name: Peds Colonoscope [id number] Procedure:             Colonoscopy Indications:           High risk colon cancer surveillance: Personal history                         of colonic polyps, Family history of colon polyps Providers:             Trenda Moots, DO Referring MD:          Jaynie Collins DO, DO (Referring MD), Gracelyn Nurse, MD (Referring MD) Medicines:             Monitored Anesthesia Care Complications:         No immediate complications. Estimated blood loss:                         Minimal. Procedure:             Pre-Anesthesia Assessment:                        - Prior to the procedure, a History and Physical was                         performed, and patient medications and allergies were                         reviewed. The patient is competent. The risks and                         benefits of the procedure and the sedation options and                         risks were discussed with the patient. All questions                         were answered and informed consent was obtained.                         Patient identification and proposed procedure were                         verified by the physician, the nurse, the anesthetist                         and the technician in the endoscopy suite. Mental                         Status Examination: alert and oriented. Airway                         Examination: normal oropharyngeal airway and neck  mobility. Respiratory Examination: clear to                         auscultation. CV Examination: RRR, no murmurs, no S3                         or S4. Prophylactic Antibiotics: The  patient does not                         require prophylactic antibiotics. Prior                         Anticoagulants: The patient has taken no anticoagulant                         or antiplatelet agents. ASA Grade Assessment: III - A                         patient with severe systemic disease. After reviewing                         the risks and benefits, the patient was deemed in                         satisfactory condition to undergo the procedure. The                         anesthesia plan was to use monitored anesthesia care                         (MAC). Immediately prior to administration of                         medications, the patient was re-assessed for adequacy                         to receive sedatives. The heart rate, respiratory                         rate, oxygen saturations, blood pressure, adequacy of                         pulmonary ventilation, and response to care were                         monitored throughout the procedure. The physical                         status of the patient was re-assessed after the                         procedure.                        After obtaining informed consent, the colonoscope was                         passed under direct vision. Throughout the procedure,  the patient's blood pressure, pulse, and oxygen                         saturations were monitored continuously. The                         Colonoscope was introduced through the anus and                         advanced to the the cecum, identified by appendiceal                         orifice and ileocecal valve. The colonoscopy was                         somewhat difficult due to a redundant colon and the                         patient's body habitus. Successful completion of the                         procedure was aided by straightening and shortening                         the scope to obtain bowel loop reduction, using scope                          torsion, applying abdominal pressure and lavage. The                         patient tolerated the procedure well. The quality of                         the bowel preparation was evaluated using the BBPS                         [facility] Bowel Preparation Scale) with scores of: Right                         Colon = 2 (minor amount of residual staining, small                         fragments of stool and/or opaque liquid, but mucosa                         seen well), Transverse Colon = 3 (entire mucosa seen                         well with no residual staining, small fragments of                         stool or opaque liquid) and Left Colon = 2 (minor                         amount of residual staining, small fragments of stool  and/or opaque liquid, but mucosa seen well). The total                         BBPS score equals 7. The quality of the bowel                         preparation was good. The ileocecal valve, appendiceal                         orifice, and rectum were photographed. Findings:      The perianal and digital rectal examinations were normal. Pertinent       negatives include normal sphincter tone.      Multiple small-mouthed diverticula were found in the entire colon.       Estimated blood loss: none.      Non-bleeding internal hemorrhoids were found during retroflexion. The       hemorrhoids were Grade II (internal hemorrhoids that prolapse but reduce       spontaneously). Estimated blood loss: none.      Two sessile polyps were found in the rectum. The polyps were 3 to 4 mm       in size. These polyps were removed with a cold snare. Resection and       retrieval were complete. Estimated blood loss was minimal.      The exam was otherwise without abnormality on direct and retroflexion       views.      A small amount of solid stool was found in the rectum, interfering with       visualization. Lavage of the area was  performed, resulting in clearance       with fair visualization. Estimated blood loss: none.      The exam was otherwise without abnormality on direct and retroflexion       views. Impression:            - Diverticulosis in the entire examined colon.                        - Non-bleeding internal hemorrhoids.                        - Two 3 to 4 mm polyps in the rectum, removed with a                         cold snare. Resected and retrieved.                        - The examination was otherwise normal on direct and                         retroflexion views.                        - Stool in the rectum.                        - The examination was otherwise normal on direct and                         retroflexion views. Recommendation:        -  Patient has a contact number available for                         emergencies. The signs and symptoms of potential                         delayed complications were discussed with the patient.                         Return to normal activities tomorrow. Written                         discharge instructions were provided to the patient.                        - Discharge patient to home.                        - Resume previous diet.                        - Continue present medications.                        - Await pathology results.                        - No ibuprofen, naproxen, or other non-steroidal                         anti-inflammatory drugs for 5 days after polyp removal.                        - Repeat colonoscopy for surveillance based on                         pathology results.                        - Return to referring physician as previously                         scheduled.                        - The findings and recommendations were discussed with                         the patient. Procedure Code(s):     --- Professional ---                        [id number], Colonoscopy, flexible; with removal of                          tumor(s), polyp(s), or other lesion(s) by snare                         technique Diagnosis Code(s):     --- Professional ---                        Z86.010, Personal history of  colonic polyps                        K64.1, Second degree hemorrhoids                        D12.8, Benign neoplasm of rectum                        Z83.71, Family history of colonic polyps                        K57.30, Diverticulosis of large intestine without                         perforation or abscess without bleeding CPT copyright 2022 American Medical Association. All rights reserved. The codes documented in this report are preliminary and upon coder review may  be revised to meet current compliance requirements. Attending Participation:      I personally performed the entire procedure. Elfredia Nevins, DO Jaynie Collins DO, DO 02/04/2024 9:41:08 AM This report has been signed electronically. Number of Addenda: 0 Note Initiated On: 02/04/2024 8:49 AM Scope Withdrawal Time: 0 hours 12 minutes 43 seconds  Total Procedure Duration: 0 hours 24 minutes 27 seconds  Estimated Blood Loss:  Estimated blood loss was minimal.      [facility]

## 2024-02-04 NOTE — Transfer of Care (Signed)
Immediate Anesthesia Transfer of Care Note  Patient: Emma Torres  Procedure(s) Performed: COLONOSCOPY WITH PROPOFOL POLYPECTOMY  Patient Location: Endoscopy Unit  Anesthesia Type:General  Level of Consciousness: drowsy and patient cooperative  Airway & Oxygen Therapy: Patient Spontanous Breathing and Patient connected to face mask oxygen  Post-op Assessment: Report given to RN and Post -op Vital signs reviewed and stable  Post vital signs: Reviewed and stable  Last Vitals:  Vitals Value Taken Time  BP    Temp    Pulse 85 02/04/24 0946  Resp 17 02/04/24 0946  SpO2 100 % 02/04/24 0946  Vitals shown include unfiled device data.  Last Pain: There were no vitals filed for this visit.       Complications: No notable events documented.

## 2024-02-04 NOTE — Anesthesia Postprocedure Evaluation (Signed)
Anesthesia Post Note  Patient: Emma Torres  Procedure(s) Performed: COLONOSCOPY WITH PROPOFOL POLYPECTOMY  Patient location during evaluation: Endoscopy Anesthesia Type: General Level of consciousness: awake and alert Pain management: pain level controlled Vital Signs Assessment: post-procedure vital signs reviewed and stable Respiratory status: spontaneous breathing, nonlabored ventilation, respiratory function stable and patient connected to nasal cannula oxygen Cardiovascular status: blood pressure returned to baseline and stable Postop Assessment: no apparent nausea or vomiting Anesthetic complications: no   No notable events documented.   Last Vitals:  Vitals:   02/04/24 0949 02/04/24 0959  BP: (!) 113/91 (!) 135/96  Pulse: 84 74  Resp: 18 18  Temp:    SpO2: 98% 97%    Last Pain:  Vitals:   02/04/24 0959  PainSc: 0-No pain                 Lenard Simmer

## 2024-02-04 NOTE — Interval H&P Note (Signed)
History and Physical Interval Note: Preprocedure H&P from 02/04/24  was reviewed and there was no interval change after seeing and examining the patient.  Written consent was obtained from the patient after discussion of risks, benefits, and alternatives. Patient has consented to proceed with Colonoscopy with possible intervention    02/04/2024 9:04 AM  Emma Torres  has presented today for surgery, with the diagnosis of Z86.0101 (ICD-10-CM) - Hx of adenomatous colonic polyps.  The various methods of treatment have been discussed with the patient and family. After consideration of risks, benefits and other options for treatment, the patient has consented to  Procedure(s): COLONOSCOPY WITH PROPOFOL (N/A) as a surgical intervention.  The patient's history has been reviewed, patient examined, no change in status, stable for surgery.  I have reviewed the patient's chart and labs.  Questions were answered to the patient's satisfaction.     Jaynie Collins

## 2024-02-04 NOTE — H&P (Signed)
Pre-Procedure H&P   Patient ID: Emma Torres is a 78 y.o. female.  Gastroenterology Provider: Jaynie Collins, DO  Referring Provider: Fransico Setters, NP PCP: Gracelyn Nurse, MD  Date: 02/04/2024  HPI Emma Torres is a 78 y.o. female who presents today for Colonoscopy for Personal and family history of colon polyps .  Patient last underwent colonoscopy in 2019 with pandiverticulosis and internal hemorrhoids.  Notably redundant and tortuous colon.  Also underwent colonoscopy in 2014 2009 which demonstrated diverticulosis.  Polyps noted in 2005.  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)    Cough    chronic   Diverticulosis    Hypercholesterolemia    Hypertension    IBS (irritable bowel syndrome)    Personal history of radiation therapy    Stage 3b chronic kidney disease (CKD) (HCC)     Past Surgical History:  Procedure Laterality Date   ABDOMINAL HYSTERECTOMY     BREAST BIOPSY Right 11/30/2022   MM RT BREAST BX W LOC DEV 1ST LESION IMAGE BX SPEC STEREO GUIDE 11/30/2022 [facility]   BREAST BIOPSY Right 12/24/2022   MM RT RADIO FREQUENCY TAG LOC MAMMO GUIDE 12/24/2022 [facility]   BREAST BIOPSY Right 02/02/2023   MM RT RADIO FREQUENCY TAG LOC MAMMO GUIDE 02/02/2023 [facility]   breast biospy Right 11/30/2022   rt stereo asymetry, coil clip, path pending   BREAST CYST ASPIRATION Left 11/30/2022   BREAST EXCISIONAL BIOPSY Right 2011   negative   BREAST LUMPECTOMY,RADIO FREQ LOCALIZER,AXILLARY SENTINEL LYMPH NODE BIOPSY Right 02/05/2023   Procedure: BREAST LUMPECTOMY,RADIO FREQ LOCALIZER,AXILLARY SENTINEL LYMPH NODE BIOPSY;   Surgeon: Sung Amabile, DO;  Location: [facility];  Service: General;  Laterality: Right;   CATARACT EXTRACTION W/PHACO Left 11/18/2017   Procedure: CATARACT EXTRACTION PHACO AND INTRAOCULAR LENS PLACEMENT (IOC);  Surgeon: Nevada Crane, MD;  Location: [facility];  Service: Ophthalmology;  Laterality: Left;  Lot # [id number] H [place]: 00:28.6 AP%: 10.8 CDE: 3.10    CATARACT EXTRACTION W/PHACO Right 01/13/2018   Procedure: CATARACT EXTRACTION PHACO AND INTRAOCULAR LENS PLACEMENT (IOC);  Surgeon: Nevada Crane, MD;  Location: [facility];  Service: Ophthalmology;  Laterality: Right;  cassette lot #  [id number] H  [place]   00:29.4 AP%   9.6 CDE2.82   COLONOSCOPY     2005, 2009, 2014   COLONOSCOPY     COLONOSCOPY WITH PROPOFOL N/A 08/29/2018   Procedure: COLONOSCOPY WITH PROPOFOL;  Surgeon: Scot Jun, MD;  Location: [facility]OSCOPY;  Service: Endoscopy;  Laterality: N/A;   RE-EXCISION OF BREAST CANCER,SUPERIOR MARGINS Right 04/08/2023   Procedure: RE-EXCISION OF RIGHT BREAST MARGINS X2;  Surgeon: Griselda Miner, MD;  Location: [facility];  Service: General;  Laterality: Right;   TONSILLECTOMY     TUBAL LIGATION     VAGINAL HYSTERECTOMY      Family History Mother father and brother with history of colon polyps No other h/o GI disease or malignancy  Review of Systems  Constitutional:  Negative for activity change, appetite change, chills, diaphoresis, fatigue, fever and unexpected weight change.  HENT:  Negative for trouble swallowing and voice change.  Respiratory:  Negative for shortness of breath and wheezing.   Cardiovascular:  Negative for chest Torres, palpitations and leg swelling.  Gastrointestinal:  Negative for abdominal distention, abdominal Torres, anal bleeding, blood in stool, constipation, diarrhea, nausea, rectal Torres and vomiting.  Musculoskeletal:  Negative for arthralgias and myalgias.  Skin:  Negative for color change and pallor.  Neurological:  Negative for  dizziness, syncope and weakness.  Psychiatric/Behavioral:  Negative for confusion.   All other systems reviewed and are negative.    Medications No current facility-administered medications on file prior to encounter.   Current Outpatient Medications on File Prior to Encounter  Medication Sig Dispense Refill   glipiZIDE (GLUCOTROL) 10 MG tablet Take 20 mg by mouth 2 (two) times daily before a meal.      lisinopril-hydrochlorothiazide (PRINZIDE,ZESTORETIC) 20-12.5 MG tablet Take 1 tablet by mouth daily.     metFORMIN (GLUCOPHAGE) 1000 MG tablet Take 1,000-1,500 mg by mouth See admin instructions. Take 1000 mg by mouth in the morning and take 1500 mg by mouth in the evening     metoprolol succinate (TOPROL-XL) 50 MG 24 hr tablet Take 50 mg by mouth daily. Take with or immediately following a meal.     pravastatin (PRAVACHOL) 40 MG tablet Take 40 mg by mouth daily.     sitaGLIPtin (JANUVIA) 100 MG tablet Take 100 mg by mouth daily.     acetaminophen (TYLENOL) 325 MG tablet Take 650 mg by mouth every 6 (six) hours as needed.     anastrozole (ARIMIDEX) 1 MG tablet Take 1 tablet (1 mg total) by mouth daily. 90 tablet 1   Biotin w/ Vitamins C & E (HAIR/SKIN/NAILS PO) Take 1 tablet by mouth daily.     Blood Glucose Monitoring Suppl (ONE TOUCH ULTRA 2) w/Device KIT See admin instructions.     Calcium Carbonate-Vitamin D (CALTRATE 600+D PO) Take 1 tablet by mouth daily. (Patient not taking: Reported on 01/19/2024)     cyanocobalamin (VITAMIN B12) 1000 MCG tablet Take 1,000 mcg by mouth daily.     glucose blood (ONETOUCH ULTRA) test strip Use once daily E11.9     ibuprofen (ADVIL,MOTRIN) 200 MG tablet Take 200 mg by mouth every 6 (six) hours as needed for headache or moderate Torres.     Lancets Misc. (UNISTIK 2 NORMAL) MISC Use 1 each once daily Use as instructed. One Touch Ultra lancets E11.9     lidocaine-prilocaine (EMLA) cream Apply topically. (Patient not taking: Reported on 01/19/2024)      Magnesium 250 MG TABS Take 250 mg by mouth daily.     oxyCODONE (ROXICODONE) 5 MG immediate release tablet Take 1 tablet (5 mg total) by mouth every 6 (six) hours as needed for severe Torres. 15 tablet 0   oxyCODONE-acetaminophen (PERCOCET) 5-325 MG tablet Take 1 tablet by mouth every 8 (eight) hours as needed for severe Torres. 6 tablet 0   silver sulfADIAZINE (SILVADENE) 1 % cream Apply 1 Application topically daily. (Patient not taking: Reported on 01/19/2024) 50 g 0   venlafaxine (EFFEXOR) 37.5 MG tablet TAKE 1 TABLET BY MOUTH EVERY DAY 90 tablet 1    Pertinent medications related to GI and procedure were reviewed by me with the patient prior to the procedure   Current Facility-Administered Medications:    0.9 %  sodium chloride infusion, , Intravenous, Continuous, Jaynie Collins, DO, Last Rate: 20 mL/hr at 02/04/24 0903, Continued from Pre-op at 02/04/24 0903  sodium chloride 20 mL/hr at 02/04/24 [phone]  Allergies  Allergen Reactions   Tape Rash and Other (See Comments)    Adhesive   Allergies were reviewed by me prior to the procedure  Objective   Vital signs reviewed. wnl   Physical Exam Vitals and nursing note reviewed.  Constitutional:      General: She is not in acute distress.    Appearance: Normal appearance. She is not ill-appearing, toxic-appearing or diaphoretic.  HENT:     Head: Normocephalic and atraumatic.     Nose: Nose normal.     Mouth/Throat:     Mouth: Mucous membranes are moist.     Pharynx: Oropharynx is clear.  Eyes:     General: No scleral icterus.    Extraocular Movements: Extraocular movements intact.  Cardiovascular:     Rate and Rhythm: Normal rate and regular rhythm.     Heart sounds: Normal heart sounds. No murmur heard.    No friction rub. No gallop.  Pulmonary:     Effort: Pulmonary effort is normal. No respiratory distress.     Breath sounds: Normal breath sounds. No wheezing, rhonchi or rales.  Abdominal:     General: Bowel  sounds are normal. There is no distension.     Palpations: Abdomen is soft.     Tenderness: There is no abdominal tenderness. There is no guarding or rebound.  Musculoskeletal:     Cervical back: Neck supple.     Right lower leg: No edema.     Left lower leg: No edema.  Skin:    General: Skin is warm and dry.     Coloration: Skin is not jaundiced or pale.  Neurological:     General: No focal deficit present.     Mental Status: She is alert and oriented to person, place, and time. Mental status is at baseline.  Psychiatric:        Mood and Affect: Mood normal.        Behavior: Behavior normal.        Thought Content: Thought content normal.        Judgment: Judgment normal.      Assessment:  Emma Torres is a 78 y.o. female  who presents today for Colonoscopy for Personal and family history of colon polyps .  Plan:  Colonoscopy with possible intervention today  Colonoscopy with possible biopsy, control of bleeding, polypectomy, and interventions as necessary has been discussed with the patient/patient representative. Informed consent was obtained from the patient/patient representative after explaining the indication, nature, and risks of the procedure including but not limited to death, bleeding, perforation, missed neoplasm/lesions, cardiorespiratory compromise, and reaction to medications. Opportunity for questions was given and appropriate answers were provided. Patient/patient representative has verbalized understanding is amenable to undergoing the procedure.   Jaynie Collins, DO  [facility] Gastroenterology  Portions of the record may have been created with voice recognition software. Occasional wrong-word or 'sound-a-like' substitutions may have occurred due to the inherent limitations of voice recognition software.  Read the chart carefully and recognize, using context, where substitutions may have occurred.

## 2024-02-04 NOTE — Anesthesia Procedure Notes (Signed)
Procedure Name: General with mask airway Date/Time: 02/04/2024 9:08 AM  Performed by: Mohammed Kindle, CRNAPre-anesthesia Checklist: Patient identified, Emergency Drugs available, Suction available and Patient being monitored Patient Re-evaluated:Patient Re-evaluated prior to induction Oxygen Delivery Method: Simple face mask Induction Type: IV induction Placement Confirmation: positive ETCO2 and breath sounds checked- equal and bilateral Dental Injury: Teeth and Oropharynx as per pre-operative assessment

## 2024-02-04 NOTE — Anesthesia Preprocedure Evaluation (Signed)
Anesthesia Evaluation  Patient identified by MRN, date of birth, ID band Patient awake    Reviewed: Allergy & Precautions, H&P , NPO status , Patient's Chart, lab work & pertinent test results, reviewed documented beta blocker date and time   History of Anesthesia Complications Negative for: history of anesthetic complications  Airway Mallampati: II   Neck ROM: full    Dental  (+) Poor Dentition, Dental Advidsory Given, Caps Bridge on top left:   Pulmonary neg pulmonary ROS   Pulmonary exam normal        Cardiovascular hypertension, (-) angina (-) Past MI and (-) Cardiac Stents Normal cardiovascular exam(-) dysrhythmias (-) Valvular Problems/Murmurs Rhythm:regular Rate:Normal     Neuro/Psych negative neurological ROS  negative psych ROS   GI/Hepatic negative GI ROS, Neg liver ROS,,,  Endo/Other  diabetes, Well Controlled, Type 2, Oral Hypoglycemic Agents  Class 3 obesity  Renal/GU CRFRenal disease  negative genitourinary   Musculoskeletal   Abdominal   Peds  Hematology negative hematology ROS (+)   Anesthesia Other Findings Past Medical History: No date: Arthritis No date: Chronic kidney disease No date: Cough     Comment:  chronic No date: Diabetes mellitus without complication (HCC) No date: Hypertension No date: IBS (irritable bowel syndrome) Past Surgical History: No date: ABDOMINAL HYSTERECTOMY 2011: BREAST EXCISIONAL BIOPSY; Right     Comment:  negative 11/18/2017: CATARACT EXTRACTION W/PHACO; Left     Comment:  Procedure: CATARACT EXTRACTION PHACO AND INTRAOCULAR               LENS PLACEMENT (IOC);  Surgeon: Nevada Crane, MD;                Location: [facility];  Service: Ophthalmology;  Laterality:              Left;  Lot # [id number] [place]: 00:28.6 AP%: 10.8 CDE:               3.10  01/13/2018: CATARACT EXTRACTION W/PHACO; Right     Comment:  Procedure: CATARACT EXTRACTION PHACO AND INTRAOCULAR                LENS PLACEMENT (IOC);  Surgeon: Nevada Crane, MD;                Location: [facility];  Service: Ophthalmology;  Laterality:              Right;  cassette lot #  [id number]  [place]   00:29.4 AP%                 9.6 CDE2.82 No date: TONSILLECTOMY No date: TUBAL LIGATION BMI    Body Mass Index:  35.68 kg/m     Reproductive/Obstetrics negative OB ROS                             Anesthesia Physical Anesthesia Plan  ASA: 3  Anesthesia Plan: General   Post-op Pain Management:    Induction: Intravenous  PONV Risk Score and Plan: 3 and Treatment may vary due to age or medical condition, Propofol infusion and TIVA  Airway Management Planned: Natural Airway and Nasal Cannula  Additional Equipment:   Intra-op Plan:   Post-operative Plan:   Informed Consent: I have reviewed the patients History and Physical, chart, labs and discussed the procedure including the risks, benefits and alternatives for the proposed anesthesia with the patient or authorized representative who has indicated his/her understanding and  acceptance.     Dental Advisory Given  Plan Discussed with: CRNA  Anesthesia Plan Comments:         Anesthesia Quick Evaluation

## 2024-02-07 ENCOUNTER — Encounter: Payer: Self-pay | Admitting: Gastroenterology

## 2024-02-07 LAB — SURGICAL PATHOLOGY

## 2024-03-20 ENCOUNTER — Inpatient Hospital Stay: Payer: Medicare Other | Admitting: Oncology

## 2024-03-20 ENCOUNTER — Encounter: Payer: Self-pay | Admitting: Oncology

## 2024-03-20 ENCOUNTER — Inpatient Hospital Stay: Payer: Medicare Other | Attending: Oncology

## 2024-03-20 VITALS — BP 158/73 | HR 57 | Temp 96.7°F | Resp 18 | Wt 201.8 lb

## 2024-03-20 DIAGNOSIS — C50911 Malignant neoplasm of unspecified site of right female breast: Secondary | ICD-10-CM | POA: Diagnosis present

## 2024-03-20 DIAGNOSIS — Z923 Personal history of irradiation: Secondary | ICD-10-CM | POA: Insufficient documentation

## 2024-03-20 DIAGNOSIS — Z79811 Long term (current) use of aromatase inhibitors: Secondary | ICD-10-CM | POA: Diagnosis not present

## 2024-03-20 DIAGNOSIS — Z803 Family history of malignant neoplasm of breast: Secondary | ICD-10-CM | POA: Diagnosis not present

## 2024-03-20 DIAGNOSIS — Z8042 Family history of malignant neoplasm of prostate: Secondary | ICD-10-CM | POA: Diagnosis not present

## 2024-03-20 DIAGNOSIS — Z90710 Acquired absence of both cervix and uterus: Secondary | ICD-10-CM | POA: Insufficient documentation

## 2024-03-20 DIAGNOSIS — N1831 Chronic kidney disease, stage 3a: Secondary | ICD-10-CM

## 2024-03-20 DIAGNOSIS — Z8049 Family history of malignant neoplasm of other genital organs: Secondary | ICD-10-CM | POA: Diagnosis not present

## 2024-03-20 DIAGNOSIS — Z170 Estrogen receptor positive status [ER+]: Secondary | ICD-10-CM | POA: Insufficient documentation

## 2024-03-20 DIAGNOSIS — Z807 Family history of other malignant neoplasms of lymphoid, hematopoietic and related tissues: Secondary | ICD-10-CM | POA: Insufficient documentation

## 2024-03-20 DIAGNOSIS — C50919 Malignant neoplasm of unspecified site of unspecified female breast: Secondary | ICD-10-CM

## 2024-03-20 DIAGNOSIS — Z1732 Human epidermal growth factor receptor 2 negative status: Secondary | ICD-10-CM | POA: Insufficient documentation

## 2024-03-20 DIAGNOSIS — Z1721 Progesterone receptor positive status: Secondary | ICD-10-CM | POA: Diagnosis not present

## 2024-03-20 LAB — CBC WITH DIFFERENTIAL (CANCER CENTER ONLY)
Abs Immature Granulocytes: 0.01 10*3/uL (ref 0.00–0.07)
Basophils Absolute: 0 10*3/uL (ref 0.0–0.1)
Basophils Relative: 0 %
Eosinophils Absolute: 0 10*3/uL (ref 0.0–0.5)
Eosinophils Relative: 1 %
HCT: 40.3 % (ref 36.0–46.0)
Hemoglobin: 13.1 g/dL (ref 12.0–15.0)
Immature Granulocytes: 0 %
Lymphocytes Relative: 42 %
Lymphs Abs: 1.6 10*3/uL (ref 0.7–4.0)
MCH: 27.8 pg (ref 26.0–34.0)
MCHC: 32.5 g/dL (ref 30.0–36.0)
MCV: 85.4 fL (ref 80.0–100.0)
Monocytes Absolute: 0.4 10*3/uL (ref 0.1–1.0)
Monocytes Relative: 11 %
Neutro Abs: 1.7 10*3/uL (ref 1.7–7.7)
Neutrophils Relative %: 46 %
Platelet Count: 210 10*3/uL (ref 150–400)
RBC: 4.72 MIL/uL (ref 3.87–5.11)
RDW: 13.9 % (ref 11.5–15.5)
WBC Count: 3.8 10*3/uL — ABNORMAL LOW (ref 4.0–10.5)
nRBC: 0 % (ref 0.0–0.2)

## 2024-03-20 LAB — CMP (CANCER CENTER ONLY)
ALT: 20 U/L (ref 0–44)
AST: 20 U/L (ref 15–41)
Albumin: 4 g/dL (ref 3.5–5.0)
Alkaline Phosphatase: 55 U/L (ref 38–126)
Anion gap: 10 (ref 5–15)
BUN: 20 mg/dL (ref 8–23)
CO2: 26 mmol/L (ref 22–32)
Calcium: 9.2 mg/dL (ref 8.9–10.3)
Chloride: 101 mmol/L (ref 98–111)
Creatinine: 0.96 mg/dL (ref 0.44–1.00)
GFR, Estimated: >60 mL/min (ref >60)
Glucose, Bld: 167 mg/dL — ABNORMAL HIGH (ref 70–99)
Potassium: 3.7 mmol/L (ref 3.5–5.1)
Sodium: 137 mmol/L (ref 135–145)
Total Bilirubin: 0.7 mg/dL (ref 0.0–1.2)
Total Protein: 6.6 g/dL (ref 6.5–8.1)

## 2024-03-20 MED ORDER — ANASTROZOLE 1 MG PO TABS: 1.0000 mg | ORAL_TABLET | Freq: Every day | ORAL | 1 refills | Status: DC

## 2024-03-20 MED ORDER — VENLAFAXINE HCL 37.5 MG PO TABS: 37.5000 mg | ORAL_TABLET | Freq: Every day | ORAL | 1 refills | Status: DC

## 2024-03-20 NOTE — Assessment & Plan Note (Addendum)
 Right breast invasive carcinoma. 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  Positive posterior margin. She underwent re-excision, no residual disease.  Oncotype DX  on both mass were low, no chemotherapy benefit  S/p adjuvant radiation.  Continue Arimidex 1mg  daily.  Refill prescription was sent. Continue Effexor 37.5 mg daily for hot flash  Annual mammogram surveillance - next due Dec 2025 Patient prefers to get mammogram set up through PCP office.

## 2024-03-20 NOTE — Assessment & Plan Note (Signed)
Recommend calcium and vitamin D supplementation.  09/01/2023 DEXA showed normal bone density.

## 2024-03-20 NOTE — Assessment & Plan Note (Signed)
 Encourage oral hydration and avoid nephrotoxins.

## 2024-03-20 NOTE — Progress Notes (Signed)
 Hematology/Oncology Progress note [phone] [phone]     CHIEF COMPLAINTS/PURPOSE OF CONSULTATION:  Right breast invasive mammary carcinoma.   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  Positive posterior margin. She underwent re-excision, no residual disease.  Oncotype DX  on both mass were low, no chemotherapy benefit  S/p adjuvant radiation.  Continue Arimidex 1mg  daily.  Refill prescription was sent. Continue Effexor 37.5 mg daily for hot flash  Annual mammogram surveillance - next due Dec 2025 Patient prefers to get mammogram set up through PCP office.  Aromatase inhibitor use Recommend calcium and vitamin D supplementation.  09/01/2023 DEXA showed normal bone density.   CKD (chronic kidney disease) stage 3, GFR 30-59 ml/min (HCC) Encourage oral hydration and avoid nephrotoxins.    . Orders Placed This Encounter  Procedures   CBC with Differential (Cancer Center Only)    Standing Status:   Future    Expected Date:   09/19/2024    Expiration Date:   03/20/2025   CMP (Cancer Center only)    Standing Status:   Future    Expected Date:   09/19/2024    Expiration Date:   03/20/2025   Follow-up 6 months  Rickard Patience, MD, PhD [facility] Hematology Oncology 03/20/2024   HISTORY OF PRESENTING ILLNESS:  Emma Torres 78 y.o. female presents to establish care for Right breast invasive mammary carcinoma I have reviewed her chart and materials related to her cancer extensively and collaborated history with the patient. Summary of oncologic history is as follows: Oncology History  Invasive carcinoma of breast (HCC)  10/24/2021 Mammogram   Bilateral diagnostic mammogram 1.  Stable appearance of probably benign left breast mass. 2.  No findings of malignancy in either breast.   11/09/2022 Mammogram   Bilateral diagnostic mammogram and [place] bilateral breast  1. There is an  area of possible subtle architectural distortion in the RIGHT upper breast at posterior depth. Recommend stereotactic guided biopsy for definitive characterization. 2. There is a 14 mm mass in the LEFT breast at 3 o'clock 2 cm rom the nipple. This likely reflects a complicated cyst. Given hypoechoic appearance on today's exam, recommend ultrasound-guidedaspiration for definitive characterization. 3. Stable LEFT breast mass at 12 o'clock for greater than 2 years, consistent with a benign etiology.   12/23/2022 Initial Diagnosis   Invasive carcinoma of breast  Patient gets mammogram surveillance for left breast mass which has been stable. Recent mammogram showed right breast distortion.   11/30/2022 Right breast stereotactic biopsy showed Invasive mammary carcinoma with mixed lobular and ductal features, Grade 2, no LVI, ER 90%+, PR 90%+, HER2 - [facility] 0]   Menarche at age of 35 First live birth at age of 54 OCP use: denies History of hysterectomy: yes Menopausal status: postmenopausal History of HRT use: 2 years History of chest radiation: denies Number of previous breast biopsies:  right breast.    12/23/2022 Cancer Staging   Staging form: Breast, AJCC 8th Edition - Clinical: Stage Unknown (cTX, cN0, cM0, G2, ER+, PR+, HER2-) - Signed by Rickard Patience, MD on 12/23/2022 Histologic grading system: 3 grade system    Genetic Testing   No pathogenic variants identified on the [facility] Multi-Cancer+RNA panel. 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 The report date is 01/30/2023.  The Multi-Cancer + RNA Panel offered by [facility] includes sequencing and/or deletion/duplication analysis of the following 70 genes:  AIP*, ALK, APC*, ATM*, AXIN2*,  BAP1*, BARD1*, BLM*, BMPR1A*, BRCA1*, BRCA2*, BRIP1*, CDC73*, CDH1*, CDK4, CDKN1B*, CDKN2A, CHEK2*, CTNNA1*, DICER1*, EPCAM, EGFR, FH*, FLCN*, GREM1, HOXB13, KIT, LZTR1, MAX*, MBD4, MEN1*, MET, MITF, MLH1*, MSH2*, MSH3*, MSH6*, MUTYH*, NF1*, NF2*, NTHL1*,  PALB2*, PDGFRA, PMS2*, POLD1*, POLE*, POT1*, PRKAR1A*, PTCH1*, PTEN*, RAD51C*, RAD51D*, RB1*, RET, SDHA*, SDHAF2*, SDHB*, SDHC*, SDHD*, SMAD4*, SMARCA4*, SMARCB1*, SMARCE1*, STK11*, SUFU*, TMEM127*, TP53*, TSC1*, TSC2*, VHL*. RNA analysis is performed for * genes.   12/29/2022 Imaging   Bilateral MRI breast w wo contrast  1. Indeterminate 0.6 cm mass involving the UPPER INNER QUADRANT of the RIGHT breast at anterior depth. 2. Indeterminate 1.7 cm linear non-mass enhancement involving the LOWER OUTER QUADRANT of the LEFT breast at middle to posterior depth. 3. Indeterminate 1.8 cm mass involving the LOWER OUTER QUADRANT of the LEFT breast at far posterior depth. 4. No pathologic lymphadenopathy. 5. 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   01/25/2023 Procedure   Patient underwent MRI biopsy of right breast upper inner quadrant anterior depth as well as non-mass enhancement in the lower outer quadrant left breast and indeterminate mass involving lower outer quadrant of the left breast.  Pathology showed 1. Breast, right, needle core biopsy, upper anterior INVASIVE MODERATELY DIFFERENTIATED DUCTAL CARCINOMA WITH LOBULAR FEATURES, GRADE 2 (3+2+1) FOCAL DUCTAL CARCINOMA IN SITU, INTERMEDIATE NUCLEAR GRADE, SOLID TYPE WITHOUT NECROSIS TUBULE FORMATION: SCORE 3 NUCLEAR PLEOMORPHISM: SCORE 2 MITOTIC COUNT: SCORE 1 TOTAL SCORE: 6 OF 9 OVERALL GRADE: GRADE 2 RARE MICROCALCIFICATION PRESENT WITHIN BENIGN DUCT NEGATIVE FOR ANGIOLYMPHATIC INVASION TUMOR MEASURES 4 MM IN GREATEST LINEAR EXTENT ER 95% positive, PR 90% positive, Ki-67 5%.  HER2 negative [IHC1+].  2. Breast, left, needle core biopsy, lateral BENIGN BREAST WITH FIBROCYSTIC CHANGES INCLUDING STROMAL FIBROSIS, APOCRINE METAPLASIA, ADENOSIS AND USUAL DUCT HYPERPLASIA MICROCALCIFICATIONS PRESENT WITHIN DUCT HYPERPLASIA AND ADENOSIS NEGATIVE FOR CARCINOMA  3. Breast, left,  needle core biopsy, LOQ CONSISTENT WITH BENIGN FIBROADENOMA ADJACENT FIBROCYSTIC CHANGES INCLUDING STROMAL FIBROSIS AND ADENOSIS RARE MICROCALCIFICATION WITHIN A FIBROADENOMA NEGATIVE FOR CARCINOMA    02/05/2023 Surgery   Patient underwent right breast lumpectomy of right anterior mass, right posterior mass and right medial margin reexcision with sentinel lymph node biopsy Pathology showed pT2 pN0   A.  BREAST, RIGHT, ANTERIOR MASS; EXCISION:- MULTIPLE FOCI (at least 6) OF INVASIVE DUCTAL CARCINOMA WITH LOBULAR FEATURES, LARGEST FOCI 6.2 MM; MARGINS NEGATIVE.  Oncotype DX recurrence score of this mass is 24, <1% chemotherapy benefit.]   B.  BREAST, RIGHT, POSTERIOR MASS; EXCISION: - MULTIFOCAL INVASIVE DUCTAL CARCINOMA WITH LOBULAR FEATURES, LARGEST FOCI 21 mm, LARGEST FOCI FOCALLY EXTENDS TO MEDIAL MARGIN (REEXCISED IN C), SEPARATE 1 MM FOCI EXTENDS TO INFERIOR MARGIN [Oncotype DX recurrence score of this mass is 12, no chemotherapy benefit.]  C.  BREAST, RIGHT, MEDIAL MARGIN, REEXCISION: - FOCI OF INVASIVE DUCTAL CARCINOMA WITH LOBULAR FEATURES 2.5 mm, NOT AT MARGIN.  D.  RIGHT SENTINEL LYMPH NODE; EXCISION: - NO EVIDENCE OF METASTATIC CARCINOMA IN 1 SENTINEL LYMPH NODE   TUMOR  Histologic Type: Invasive ductal carcinoma with lobular features.  Histologic Grade (Not[place] Histologic Score)       Glandular (Acinar)/Tubular Differentiation: 3 (of 3)       Nuclear Pleomorphism: 2 (of 3)       Mitotic Rate: 1 (7f 3)       Overall Grade: 2 (of 3)  Tumor Size: 6.2 mm  Tumor Focality: Multifocal  Ductal Carcinoma In Situ (DCIS): Not identified  Tumor Extent: N/A  Lymphatic and/or Vascular  Invasion: Not identified  Treatment Effect in the Breast: No known presurgical therapy   MARGINS  Margin Status for Invasive Carcinoma: All margins negative for invasive  carcinoma      Distance from closest margin: 1.6 mm       Specify closest margin: Posterior      03/02/2023 Cancer  Staging   Staging form: Breast, AJCC 8th Edition - Pathologic: Stage IA (pT2, pN0, cM0, G2, ER+, PR+, HER2-, Oncotype DX score: 12) - Signed by Rickard Patience, MD on 03/02/2023 Stage prefix: Initial diagnosis Multigene prognostic tests performed: Oncotype DX Recurrence score range: Greater than or equal to 11 Histologic grading system: 3 grade system   04/08/2023 Surgery   S/p re-excision by Dr. Stephens November. BREAST, RIGHT INFERIOR/ANTERIOR MARGIN, OF SUPERIOR LUMPECTOMY:  - Benign breast parenchyma with previous procedure-related changes  - Negative for carcinoma   B. BREAST, RIGHT INFERIOR/MIDDLE MARGIN, OF SUPERIOR LUMPECTOMY:  - Benign breast parenchyma with previous procedure-related changes  - Negative for carcinoma   C. BREAST, RIGHT INFERIOR/DEEP MARGIN, OF SUPERIOR LUMPECTOMY:  - Benign breast parenchyma with previous procedure-related changes  - Negative for carcinoma   D. BREAST, RIGHT INFERIOR MARGIN, OF INFERIOR LUMPECTOMY:  - Benign breast parenchyma with previous procedure-related changes  - Negative for carcinoma    05/11/2023 - 06/09/2023 Radiation Therapy   Adjuvant radiation to right breast   06/23/2023 -  Anti-estrogen oral therapy   Start on Arimidex 1 mg daily.    Patient has positive family history of breast cancer and uterine cancer.    INTERVAL HISTORY Emma Torres is a 78 y.o. female who has above history reviewed by me today presents for follow up visit for right breast cancer.  Patient takes Arimidex 1 mg daily.  Overall she tolerates well with hot flashes manageable with Effexor 37.5 mg daily.  No new complaints today.  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)    Cough    chronic   Diverticulosis    Hypercholesterolemia    Hypertension    IBS (irritable bowel  syndrome)    Personal history of radiation therapy    Stage 3b chronic kidney disease (CKD) (HCC)     SURGICAL HISTORY: Past Surgical History:  Procedure Laterality Date   ABDOMINAL HYSTERECTOMY     BREAST BIOPSY Right 11/30/2022   MM RT BREAST BX W LOC DEV 1ST LESION IMAGE BX SPEC STEREO GUIDE 11/30/2022 [facility]   BREAST BIOPSY Right 12/24/2022   MM RT RADIO FREQUENCY TAG LOC MAMMO GUIDE 12/24/2022 [facility]   BREAST BIOPSY Right 02/02/2023   MM RT RADIO FREQUENCY TAG LOC MAMMO GUIDE 02/02/2023 [facility]   breast biospy Right 11/30/2022   rt stereo asymetry, coil clip, path pending   BREAST CYST ASPIRATION Left 11/30/2022   BREAST EXCISIONAL BIOPSY Right 2011   negative   BREAST LUMPECTOMY,RADIO FREQ LOCALIZER,AXILLARY SENTINEL LYMPH NODE BIOPSY Right 02/05/2023   Procedure: BREAST LUMPECTOMY,RADIO FREQ LOCALIZER,AXILLARY SENTINEL LYMPH NODE BIOPSY;  Surgeon: Sung Amabile, DO;  Location: [facility];  Service: General;  Laterality: Right;   CATARACT EXTRACTION W/PHACO Left 11/18/2017   Procedure: CATARACT EXTRACTION PHACO AND INTRAOCULAR LENS PLACEMENT (IOC);  Surgeon: Nevada Crane, MD;  Location: [facility];  Service: Ophthalmology;  Laterality: Left;  Lot # [id number]  [place]: 00:28.6 AP%: 10.8 CDE: 3.10    CATARACT EXTRACTION W/PHACO Right 01/13/2018   Procedure: CATARACT EXTRACTION PHACO AND INTRAOCULAR LENS PLACEMENT (IOC);  Surgeon: Nevada Crane, MD;  Location: [facility];  Service: Ophthalmology;  Laterality: Right;  cassette lot #  [id number] H  [place]   00:29.4 AP%   9.6 CDE2.82   COLONOSCOPY     2005, 2009, 2014   COLONOSCOPY     COLONOSCOPY WITH PROPOFOL N/A 08/29/2018   Procedure: COLONOSCOPY WITH PROPOFOL;  Surgeon: Scot Jun, MD;  Location: [facility] END[facility]COPY;  Service: Endoscopy;  Laterality: N/A;   COLONOSCOPY WITH PROPOFOL N/A 02/04/2024   Procedure: COLONOSCOPY WITH PROPOFOL;  Surgeon: Jaynie Collins, DO;  Location: [facility] ENDOSCOPY;   Service: Gastroenterology;  Laterality: N/A;   POLYPECTOMY  02/04/2024   Procedure: POLYPECTOMY;  Surgeon: Jaynie Collins, DO;  Location: [facility] ENDOSCOPY;  Service: Gastroenterology;;   RE-EXCISION OF BREAST CANCER,SUPERIOR MARGINS Right 04/08/2023   Procedure: RE-EXCISION OF RIGHT BREAST MARGINS X2;  Surgeon: Griselda Miner, MD;  Location: [facility];  Service: General;  Laterality: Right;   TONSILLECTOMY     TUBAL LIGATION     VAGINAL HYSTERECTOMY      SOCIAL HISTORY: Social History   Socioeconomic History   Marital status: Married    Spouse name: Homero Fellers   Number of children: 2   Years of education: Not on file   Highest education level: Not on file  Occupational History   Not on file  Tobacco Use   Smoking status: Never   Smokeless tobacco: Never  Vaping Use   Vaping status: Never Used  Substance and Sexual Activity   Alcohol use: No   Drug use: No   Sexual activity: Not on file  Other Topics Concern   Not on file  Social History Narrative   Not on file   Social Drivers of Health   Financial Resource Strain: Low Risk  (12/02/2023)   Received from [facility]   Overall Financial Resource Strain (CARDIA)    Difficulty of Paying Living Expenses: Not very hard  Food Insecurity: No Food Insecurity (12/02/2023)   Received from [facility]   Hunger Vital Sign    Ran Out of Food in the Last Year: Never true    Worried About Running Out of Food in the Last Year: Never true  Transportation Needs: No Transportation Needs (12/02/2023)   Received from  [facility]   PRAPARE - Transportation    Lack of Transportation (Non-Medical): No    In the past 12 months, has lack of transportation kept you from medical appointments or from getting medications?: No  Physical Activity: Not on file  Stress: Not on file  Social Connections: Not on file  Intimate Partner Violence: Not on file    FAMILY  HISTORY: Family History  Problem Relation Age of Onset   Prostate cancer Brother 51   Multiple myeloma Brother 13   Breast cancer Maternal Aunt        dx 19s   Breast cancer Maternal Aunt        dx 65s   Uterine cancer Maternal Grandmother        dx 82s   Breast cancer Cousin        dx 79s    ALLERGIES:  is allergic to tape.  MEDICATIONS:  Current Outpatient Medications  Medication Sig Dispense Refill   acetaminophen (TYLENOL) 325 MG tablet Take 650 mg by mouth  every 6 (six) hours as needed.     Biotin w/ Vitamins C & E (HAIR/SKIN/NAILS PO) Take 1 tablet by mouth daily.     Blood Glucose Monitoring Suppl (ONE TOUCH ULTRA 2) w/Device KIT See admin instructions.     cyanocobalamin (VITAMIN B12) 1000 MCG tablet Take 1,000 mcg by mouth daily.     glipiZIDE (GLUCOTROL) 10 MG tablet Take 20 mg by mouth 2 (two) times daily before a meal.      glucose blood (ONETOUCH ULTRA) test strip Use once daily E11.9     ibuprofen (ADVIL,MOTRIN) 200 MG tablet Take 200 mg by mouth every 6 (six) hours as needed for headache or moderate pain.     Lancets Misc. (UNISTIK 2 NORMAL) MISC Use 1 each once daily Use as instructed. One Touch Ultra lancets E11.9     lisinopril-hydrochlorothiazide (PRINZIDE,ZESTORETIC) 20-12.5 MG tablet Take 1 tablet by mouth daily.     Magnesium 250 MG TABS Take 250 mg by mouth daily.     metFORMIN (GLUCOPHAGE) 1000 MG tablet Take 1,000-1,500 mg by mouth See admin instructions. Take 1000 mg by mouth in the morning and take 1500 mg by mouth in the evening     metoprolol succinate (TOPROL-XL) 50 MG 24 hr tablet Take 50 mg by mouth daily. Take with or immediately following a meal.     oxyCODONE (ROXICODONE) 5 MG immediate release tablet Take 1 tablet (5 mg total) by mouth every 6 (six) hours as needed for severe pain. 15 tablet 0   pravastatin (PRAVACHOL) 40 MG tablet Take 40 mg by mouth daily.     sitaGLIPtin (JANUVIA) 100 MG tablet Take 100 mg by mouth daily.     anastrozole  (ARIMIDEX) 1 MG tablet Take 1 tablet (1 mg total) by mouth daily. 90 tablet 1   Calcium Carbonate-Vitamin D (CALTRATE 600+D PO) Take 1 tablet by mouth daily. (Patient not taking: Reported on 01/19/2024)     lidocaine-prilocaine (EMLA) cream Apply topically. (Patient not taking: Reported on 03/20/2024)     silver sulfADIAZINE (SILVADENE) 1 % cream Apply 1 Application topically daily. (Patient not taking: Reported on 03/20/2024) 50 g 0   venlafaxine (EFFEXOR) 37.5 MG tablet Take 1 tablet (37.5 mg total) by mouth daily. 90 tablet 1   No current facility-administered medications for this visit.    Review of Systems  Constitutional:  Negative for appetite change, chills, fatigue and fever.  HENT:   Negative for hearing loss and voice change.   Eyes:  Negative for eye problems.  Respiratory:  Negative for chest tightness and cough.   Cardiovascular:  Negative for chest pain.  Gastrointestinal:  Negative for abdominal distention, abdominal pain and blood in stool.  Endocrine: Positive for hot flashes.  Genitourinary:  Negative for difficulty urinating and frequency.   Musculoskeletal:  Positive for arthralgias.  Skin:  Negative for itching and rash.  Neurological:  Negative for extremity weakness.  Hematological:  Negative for adenopathy.  Psychiatric/Behavioral:  Negative for confusion.      PHYSICAL EXAMINATION: ECOG PERFORMANCE STATUS: 1 - Symptomatic but completely ambulatory  Vitals:   03/20/24 1007 03/20/24 1012  BP: (!) 161/63 (!) 158/73  Pulse: (!) 57   Resp: 18   Temp: (!) 96.7 F (35.9 C)   SpO2: 94%    Filed Weights   03/20/24 1007  Weight: 201 lb 12.8 oz (91.5 kg)    Physical Exam Constitutional:      General: She is not in acute distress.  Appearance: She is not diaphoretic.  HENT:     Head: Normocephalic.  Eyes:     General: No scleral icterus. Cardiovascular:     Rate and Rhythm: Normal rate.  Pulmonary:     Effort: Pulmonary effort is normal. No  respiratory distress.     Breath sounds: No wheezing.  Abdominal:     General: There is no distension.     Palpations: Abdomen is soft.  Musculoskeletal:        General: Normal range of motion.     Cervical back: Normal range of motion and neck supple.  Skin:    General: Skin is warm and dry.     Findings: No erythema.  Neurological:     Mental Status: She is alert and oriented to person, place, and time.     Cranial Nerves: No cranial nerve deficit.     Motor: No abnormal muscle tone.     Coordination: Coordination normal.  Psychiatric:        Mood and Affect: Mood and affect normal.    LABORATORY DATA:  I have reviewed the data as listed    Latest Ref Rng & Units 03/20/2024    9:43 AM 09/20/2023   10:33 AM 06/08/2023    1:54 PM  CBC  WBC 4.0 - 10.5 K/uL 3.8  4.8  4.5   Hemoglobin 12.0 - 15.0 g/dL 16.1  09.6  04.5   Hematocrit 36.0 - 46.0 % 40.3  42.5  40.6   Platelets 150 - 400 K/uL 210  226  225       Latest Ref Rng & Units 03/20/2024    9:47 AM 09/20/2023   10:33 AM 04/05/2023    9:42 AM  CMP  Glucose 70 - 99 mg/dL 409  811  914   BUN 8 - 23 mg/dL 20  21  17    Creatinine 0.44 - 1.00 mg/dL 7.82  9.56  2.13   Sodium 135 - 145 mmol/L 137  135  136   Potassium 3.5 - 5.1 mmol/L 3.7  3.8  5.0   Chloride 98 - 111 mmol/L 101  100  103   CO2 22 - 32 mmol/L 26  25  25    Calcium 8.9 - 10.3 mg/dL 9.2  9.3  9.2   Total Protein 6.5 - 8.1 g/dL 6.6  7.1    Total Bilirubin 0.0 - 1.2 mg/dL 0.7  1.0    Alkaline Phos 38 - 126 U/L 55  51    AST 15 - 41 U/L 20  21    ALT 0 - 44 U/L 20  18       RADIOGRAPHIC STUDIES: I have personally reviewed the radiological images as listed and agreed with the findings in the report. No results found.

## 2024-06-18 ENCOUNTER — Emergency Department
Admission: EM | Admit: 2024-06-18 | Discharge: 2024-06-19 | Disposition: A | Discharge status: Home / Self Care | Attending: Emergency Medicine | Admitting: Emergency Medicine

## 2024-06-18 ENCOUNTER — Other Ambulatory Visit: Payer: Self-pay

## 2024-06-18 ENCOUNTER — Encounter: Payer: Self-pay | Admitting: Emergency Medicine

## 2024-06-18 DIAGNOSIS — S39012A Strain of muscle, fascia and tendon of lower back, initial encounter: Secondary | ICD-10-CM | POA: Diagnosis not present

## 2024-06-18 DIAGNOSIS — E1122 Type 2 diabetes mellitus with diabetic chronic kidney disease: Secondary | ICD-10-CM | POA: Diagnosis not present

## 2024-06-18 DIAGNOSIS — Y93E5 Activity, floor mopping and cleaning: Secondary | ICD-10-CM | POA: Diagnosis not present

## 2024-06-18 DIAGNOSIS — N189 Chronic kidney disease, unspecified: Secondary | ICD-10-CM | POA: Diagnosis not present

## 2024-06-18 DIAGNOSIS — X58XXXA Exposure to other specified factors, initial encounter: Secondary | ICD-10-CM | POA: Diagnosis not present

## 2024-06-18 DIAGNOSIS — N281 Cyst of kidney, acquired: Secondary | ICD-10-CM | POA: Diagnosis not present

## 2024-06-18 DIAGNOSIS — S3992XA Unspecified injury of lower back, initial encounter: Secondary | ICD-10-CM | POA: Diagnosis present

## 2024-06-18 DIAGNOSIS — I129 Hypertensive chronic kidney disease with stage 1 through stage 4 chronic kidney disease, or unspecified chronic kidney disease: Secondary | ICD-10-CM | POA: Insufficient documentation

## 2024-06-18 MED ORDER — LIDOCAINE 5 % EX PTCH
1.0000 | MEDICATED_PATCH | CUTANEOUS | Status: DC
  Filled 2024-06-18: qty 1

## 2024-06-18 MED ORDER — ACETAMINOPHEN 500 MG PO TABS
1000.0000 mg | ORAL_TABLET | Freq: Once | ORAL | Status: AC
  Administered 2024-06-18: 1000 mg via ORAL
  Filled 2024-06-18: qty 2

## 2024-06-18 MED ORDER — KETOROLAC TROMETHAMINE 30 MG/ML IJ SOLN
30.0000 mg | Freq: Once | INTRAMUSCULAR | Status: AC
  Administered 2024-06-19: 30 mg via INTRAMUSCULAR
  Filled 2024-06-18: qty 1

## 2024-06-18 NOTE — ED Triage Notes (Signed)
 Pt to ED from home c/o lower back pain this past week.  States mopped kitchen floor this past Monday and then pain started afterwards.  Denies known injury or falls.

## 2024-06-18 NOTE — ED Provider Notes (Signed)
 [facility] Provider Note    Event Date/Time   First MD Initiated Contact with Patient 06/18/24 2313     (approximate)   History   Back Pain   HPI  Emma Torres is a 78 y.o. female   Past medical history of CKD, diabetes, hypertension hyperlipidemia presents to the Emergency Department with left lower back pain.  This started after mopping her house yesterday.  No direct trauma noted no falls.  No radiation of pain.  No urinary symptoms.  No history of kidney stones.  She denies any motor or sensory changes or incontinence.  Independent Historian contributed to assessment above: She is here with her daughter corroborates information past medical history as above    Physical Exam   Triage Vital Signs: ED Triage Vitals  Encounter Vitals Group     BP 06/18/24 2245 (!) 135/110     Girls Systolic BP Percentile --      Girls Diastolic BP Percentile --      Boys Systolic BP Percentile --      Boys Diastolic BP Percentile --      Pulse Rate 06/18/24 2245 60     Resp 06/18/24 2245 14     Temp 06/18/24 2245 97.8 F (36.6 C)     Temp Source 06/18/24 2245 Oral     SpO2 06/18/24 2245 97 %     Weight 06/18/24 2246 189 lb (85.7 kg)     Height 06/18/24 2246 5' 6 (1.676 m)     Head Circumference --      Peak Flow --      Pain Score 06/18/24 2246 10     Pain Loc --      Pain Education --      Exclude from Growth Chart --     Most recent vital signs: Vitals:   06/18/24 2245  BP: (!) 135/110  Pulse: 60  Resp: 14  Temp: 97.8 F (36.6 C)  SpO2: 97%    General: Awake, no distress.  CV:  Good peripheral perfusion. Resp:  Normal effort.  Abd:  No distention.  Other:  Mild paraspinal lower back pain on the left side, no midline tenderness.  Soft nontender abdomen.  Motor sensor intact bilateral lower extremities.   ED Results / Procedures / Treatments   Labs (all labs ordered are listed, but only abnormal results are displayed) Labs Reviewed  - No data to display     RADIOLOGY I independently reviewed and interpreted CT abdomen pelvis see no obvious obstructive or inflammatory changes. I also reviewed radiologist's formal read.   PROCEDURES:  Critical Care performed: No  Procedures   MEDICATIONS ORDERED IN ED: Medications  ketorolac  (TORADOL ) 30 MG/ML injection 30 mg (30 mg Intramuscular Given 06/19/24 0000)  acetaminophen  (TYLENOL ) tablet 1,000 mg (1,000 mg Oral Given 06/18/24 2359)     IMPRESSION / MDM / ASSESSMENT AND PLAN / ED COURSE  I reviewed the triage vital signs and the nursing notes.                                Patient's presentation is most consistent with acute complicated illness / injury requiring diagnostic workup.  Differential diagnosis includes, but is not limited to, lumbar strain, back spasm, renal colic considered but less likely cord compromise   The patient is on the cardiac monitor to evaluate for evidence of arrhythmia and/or significant heart rate changes.  MDM:    Patient with most likely lumbar strain.    Symptoms could also reflect renal stone so got CT renal protocol and it looks negative.  I informed her of the incidental finding of the renal cyst for further evaluation as outpatient as needed.  I do think that her pain is a lumbar strain from her mopping yesterday.  She otherwise looks well feels better with some treatment will discharge and have her follow-up with PMD.       FINAL CLINICAL IMPRESSION(S) / ED DIAGNOSES   Final diagnoses:  Strain of lumbar region, initial encounter  Kidney cysts     Rx / DC Orders   ED Discharge Orders          Ordered    Menthol-Methyl Salicylate (MUSCLE RUB) 10-15 % CREA  As needed        06/19/24 0048             Note:  This document was prepared using Dragon voice recognition software and may include unintentional dictation errors.    Cyrena Mylar, MD 06/19/24 0700

## 2024-06-18 NOTE — ED Triage Notes (Signed)
 FIRST NURSE NOTE:  PT arrived via [facility] with c/o back pain, from home, low back pain took tylenol  3 hours PTA with no relief,   111/83 P60 99% RA CBG 209

## 2024-06-19 ENCOUNTER — Emergency Department

## 2024-06-19 MED ORDER — MUSCLE RUB 10-15 % EX CREA: 1.0000 | TOPICAL_CREAM | CUTANEOUS | 0 refills | Status: AC | PRN

## 2024-06-19 NOTE — Discharge Instructions (Signed)
 Take Tylenol  650 mg every 6 hours for pain. You may use Motrin 400 mg every 6 hours as needed for pain, please ask your doctor before using this medication for long-term (greater than 5 days)  Use the muscle rub topical medication as needed for pain as well.  Talk to your doctor about the incidental kidney cyst found on your imaging for repeat imaging or evaluation as needed.   Thank you for choosing us  for your health care today!  Please see your primary doctor this week for a follow up appointment.   If you have any new, worsening, or unexpected symptoms call your doctor right away or come back to the emergency department for reevaluation.  It was my pleasure to care for you today.   Ginnie EDISON Cyrena, MD

## 2024-09-19 ENCOUNTER — Inpatient Hospital Stay: Attending: Oncology

## 2024-09-19 ENCOUNTER — Encounter: Payer: Self-pay | Admitting: Oncology

## 2024-09-19 ENCOUNTER — Inpatient Hospital Stay: Admitting: Oncology

## 2024-09-19 VITALS — BP 132/62 | HR 67 | Temp 98.9°F | Resp 20 | Wt 198.4 lb

## 2024-09-19 DIAGNOSIS — C50911 Malignant neoplasm of unspecified site of right female breast: Secondary | ICD-10-CM | POA: Diagnosis present

## 2024-09-19 DIAGNOSIS — Z1732 Human epidermal growth factor receptor 2 negative status: Secondary | ICD-10-CM | POA: Insufficient documentation

## 2024-09-19 DIAGNOSIS — N1831 Chronic kidney disease, stage 3a: Secondary | ICD-10-CM

## 2024-09-19 DIAGNOSIS — N951 Menopausal and female climacteric states: Secondary | ICD-10-CM | POA: Insufficient documentation

## 2024-09-19 DIAGNOSIS — Z90710 Acquired absence of both cervix and uterus: Secondary | ICD-10-CM | POA: Diagnosis not present

## 2024-09-19 DIAGNOSIS — N1832 Chronic kidney disease, stage 3b: Secondary | ICD-10-CM | POA: Insufficient documentation

## 2024-09-19 DIAGNOSIS — Z170 Estrogen receptor positive status [ER+]: Secondary | ICD-10-CM | POA: Diagnosis not present

## 2024-09-19 DIAGNOSIS — Z79811 Long term (current) use of aromatase inhibitors: Secondary | ICD-10-CM | POA: Diagnosis not present

## 2024-09-19 DIAGNOSIS — Z8042 Family history of malignant neoplasm of prostate: Secondary | ICD-10-CM | POA: Insufficient documentation

## 2024-09-19 DIAGNOSIS — Z1721 Progesterone receptor positive status: Secondary | ICD-10-CM | POA: Insufficient documentation

## 2024-09-19 DIAGNOSIS — Z807 Family history of other malignant neoplasms of lymphoid, hematopoietic and related tissues: Secondary | ICD-10-CM | POA: Diagnosis not present

## 2024-09-19 DIAGNOSIS — C50919 Malignant neoplasm of unspecified site of unspecified female breast: Secondary | ICD-10-CM

## 2024-09-19 DIAGNOSIS — Z8049 Family history of malignant neoplasm of other genital organs: Secondary | ICD-10-CM | POA: Insufficient documentation

## 2024-09-19 DIAGNOSIS — Z803 Family history of malignant neoplasm of breast: Secondary | ICD-10-CM | POA: Insufficient documentation

## 2024-09-19 DIAGNOSIS — Z923 Personal history of irradiation: Secondary | ICD-10-CM | POA: Diagnosis not present

## 2024-09-19 LAB — CMP (CANCER CENTER ONLY)
ALT: 17 U/L (ref 0–44)
AST: 23 U/L (ref 15–41)
Albumin: 4 g/dL (ref 3.5–5.0)
Alkaline Phosphatase: 55 U/L (ref 38–126)
Anion gap: 10 (ref 5–15)
BUN: 22 mg/dL (ref 8–23)
CO2: 22 mmol/L (ref 22–32)
Calcium: 9.3 mg/dL (ref 8.9–10.3)
Chloride: 102 mmol/L (ref 98–111)
Creatinine: 1.08 mg/dL — ABNORMAL HIGH (ref 0.44–1.00)
GFR, Estimated: 52 mL/min — ABNORMAL LOW (ref >60)
Glucose, Bld: 118 mg/dL — ABNORMAL HIGH (ref 70–99)
Potassium: 4 mmol/L (ref 3.5–5.1)
Sodium: 134 mmol/L — ABNORMAL LOW (ref 135–145)
Total Bilirubin: 1.1 mg/dL (ref 0.0–1.2)
Total Protein: 6.8 g/dL (ref 6.5–8.1)

## 2024-09-19 LAB — CBC WITH DIFFERENTIAL (CANCER CENTER ONLY)
Abs Immature Granulocytes: 0.04 K/uL (ref 0.00–0.07)
Basophils Absolute: 0 K/uL (ref 0.0–0.1)
Basophils Relative: 0 %
Eosinophils Absolute: 0.1 K/uL (ref 0.0–0.5)
Eosinophils Relative: 1 %
HCT: 41.9 % (ref 36.0–46.0)
Hemoglobin: 13.6 g/dL (ref 12.0–15.0)
Immature Granulocytes: 1 %
Lymphocytes Relative: 34 %
Lymphs Abs: 2.3 K/uL (ref 0.7–4.0)
MCH: 27.8 pg (ref 26.0–34.0)
MCHC: 32.5 g/dL (ref 30.0–36.0)
MCV: 85.5 fL (ref 80.0–100.0)
Monocytes Absolute: 0.6 K/uL (ref 0.1–1.0)
Monocytes Relative: 9 %
Neutro Abs: 3.8 K/uL (ref 1.7–7.7)
Neutrophils Relative %: 55 %
Platelet Count: 236 K/uL (ref 150–400)
RBC: 4.9 MIL/uL (ref 3.87–5.11)
RDW: 14.1 % (ref 11.5–15.5)
WBC Count: 6.7 K/uL (ref 4.0–10.5)
nRBC: 0 % (ref 0.0–0.2)

## 2024-09-19 MED ORDER — VENLAFAXINE HCL ER 75 MG PO CP24: 75.0000 mg | ORAL_CAPSULE | Freq: Every day | ORAL | 5 refills | Status: DC

## 2024-09-19 MED ORDER — ANASTROZOLE 1 MG PO TABS: 1.0000 mg | ORAL_TABLET | Freq: Every day | ORAL | 1 refills | Status: AC

## 2024-09-19 NOTE — Assessment & Plan Note (Addendum)
 Right breast invasive carcinoma. 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  Positive posterior margin. She underwent re-excision, no residual disease.  Oncotype DX  on both mass were low, no chemotherapy benefit  S/p adjuvant radiation.  Continue Arimidex  1mg  daily (since July 2024).  Refill prescription was sent.   Annual mammogram surveillance - next due Dec 2025 Patient prefers to get mammogram set up through PCP office.

## 2024-09-19 NOTE — Assessment & Plan Note (Signed)
 Recommend to increase Effexor  75mg  daily for hot flash

## 2024-09-19 NOTE — Progress Notes (Signed)
 Hematology/Oncology Progress note [phone] [phone]     CHIEF COMPLAINTS/PURPOSE OF CONSULTATION:  Right breast invasive mammary carcinoma.   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  Positive posterior margin. She underwent re-excision, no residual disease.  Oncotype DX  on both mass were low, no chemotherapy benefit  S/p adjuvant radiation.  Continue Arimidex  1mg  daily.  Refill prescription was sent.   Annual mammogram surveillance - next due Dec 2025 Patient prefers to get mammogram set up through PCP office.  Vasomotor symptoms due to menopause Recommend to increase Effexor  75mg  daily for hot flash  Aromatase inhibitor use Recommend calcium and vitamin D supplementation.  09/01/2023 DEXA showed normal bone density.   CKD (chronic kidney disease) stage 3, GFR 30-59 ml/min (HCC) Encourage oral hydration and avoid nephrotoxins.    . Orders Placed This Encounter  Procedures   CBC with Differential (Cancer Center Only)    Standing Status:   Future    Expected Date:   03/19/2025    Expiration Date:   06/17/2025   CMP (Cancer Center only)    Standing Status:   Future    Expected Date:   03/19/2025    Expiration Date:   06/17/2025   Follow-up 6 months  Zelphia Cap, MD, PhD [facility] Hematology Oncology 09/19/2024   HISTORY OF PRESENTING ILLNESS:  Emma Torres 78 y.o. female presents to establish care for Right breast invasive mammary carcinoma I have reviewed her chart and materials related to her cancer extensively and collaborated history with the patient. Summary of oncologic history is as follows: Oncology History  Invasive carcinoma of breast (HCC)  10/24/2021 Mammogram   Bilateral diagnostic mammogram 1.  Stable appearance of probably benign left breast mass. 2.  No findings of malignancy in either breast.   11/09/2022 Mammogram   Bilateral diagnostic  mammogram and US  bilateral breast  1. There is an area of possible subtle architectural distortion in the RIGHT upper breast at posterior depth. Recommend stereotactic guided biopsy for definitive characterization. 2. There is a 14 mm mass in the LEFT breast at 3 o'clock 2 cm rom the nipple. This likely reflects a complicated cyst. Given hypoechoic appearance on today's exam, recommend ultrasound-guidedaspiration for definitive characterization. 3. Stable LEFT breast mass at 12 o'clock for greater than 2 years, consistent with a benign etiology.   12/23/2022 Initial Diagnosis   Invasive carcinoma of breast  Patient gets mammogram surveillance for left breast mass which has been stable. Recent mammogram showed right breast distortion.   11/30/2022 Right breast stereotactic biopsy showed Invasive mammary carcinoma with mixed lobular and ductal features, Grade 2, no LVI, ER 90%+, PR 90%+, HER2 - [facility] 0]   Menarche at age of 13 First live birth at age of 50 OCP use: denies History of hysterectomy: yes Menopausal status: postmenopausal History of HRT use: 2 years History of chest radiation: denies Number of previous breast biopsies:  right breast.    12/23/2022 Cancer Staging   Staging form: Breast, AJCC 8th Edition - Clinical: Stage Unknown (cTX, cN0, cM0, G2, ER+, PR+, HER2-) - Signed by Cap Zelphia, MD on 12/23/2022 Histologic grading system: 3 grade system    Genetic Testing   No pathogenic variants identified on the [facility] Multi-Cancer+RNA panel. 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 The report date is 01/30/2023.  The Multi-Cancer + RNA Panel offered by [facility] includes sequencing and/or deletion/duplication analysis of the following  70 genes:  AIP*, ALK, APC*, ATM*, AXIN2*, BAP1*, BARD1*, BLM*, BMPR1A*, BRCA1*, BRCA2*, BRIP1*, CDC73*, CDH1*, CDK4, CDKN1B*, CDKN2A, CHEK2*, CTNNA1*, DICER1*, EPCAM, EGFR, FH*, FLCN*, GREM1, HOXB13, KIT, LZTR1, MAX*, MBD4, MEN1*, MET, MITF, MLH1*,  MSH2*, MSH3*, MSH6*, MUTYH*, NF1*, NF2*, NTHL1*, PALB2*, PDGFRA, PMS2*, POLD1*, POLE*, POT1*, PRKAR1A*, PTCH1*, PTEN*, RAD51C*, RAD51D*, RB1*, RET, SDHA*, SDHAF2*, SDHB*, SDHC*, SDHD*, SMAD4*, SMARCA4*, SMARCB1*, SMARCE1*, STK11*, SUFU*, TMEM127*, TP53*, TSC1*, TSC2*, VHL*. RNA analysis is performed for * genes.   12/29/2022 Imaging   Bilateral MRI breast w wo contrast  1. Indeterminate 0.6 cm mass involving the UPPER INNER QUADRANT of the RIGHT breast at anterior depth. 2. Indeterminate 1.7 cm linear non-mass enhancement involving the LOWER OUTER QUADRANT of the LEFT breast at middle to posterior depth. 3. Indeterminate 1.8 cm mass involving the LOWER OUTER QUADRANT of the LEFT breast at far posterior depth. 4. No pathologic lymphadenopathy. 5. 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   01/25/2023 Procedure   Patient underwent MRI biopsy of right breast upper inner quadrant anterior depth as well as non-mass enhancement in the lower outer quadrant left breast and indeterminate mass involving lower outer quadrant of the left breast.  Pathology showed 1. Breast, right, needle core biopsy, upper anterior INVASIVE MODERATELY DIFFERENTIATED DUCTAL CARCINOMA WITH LOBULAR FEATURES, GRADE 2 (3+2+1) FOCAL DUCTAL CARCINOMA IN SITU, INTERMEDIATE NUCLEAR GRADE, SOLID TYPE WITHOUT NECROSIS TUBULE FORMATION: SCORE 3 NUCLEAR PLEOMORPHISM: SCORE 2 MITOTIC COUNT: SCORE 1 TOTAL SCORE: 6 OF 9 OVERALL GRADE: GRADE 2 RARE MICROCALCIFICATION PRESENT WITHIN BENIGN DUCT NEGATIVE FOR ANGIOLYMPHATIC INVASION TUMOR MEASURES 4 MM IN GREATEST LINEAR EXTENT ER 95% positive, PR 90% positive, Ki-67 5%.  HER2 negative [IHC1+].  2. Breast, left, needle core biopsy, lateral BENIGN BREAST WITH FIBROCYSTIC CHANGES INCLUDING STROMAL FIBROSIS, APOCRINE METAPLASIA, ADENOSIS AND USUAL DUCT HYPERPLASIA MICROCALCIFICATIONS PRESENT WITHIN DUCT HYPERPLASIA AND  ADENOSIS NEGATIVE FOR CARCINOMA  3. Breast, left, needle core biopsy, LOQ CONSISTENT WITH BENIGN FIBROADENOMA ADJACENT FIBROCYSTIC CHANGES INCLUDING STROMAL FIBROSIS AND ADENOSIS RARE MICROCALCIFICATION WITHIN A FIBROADENOMA NEGATIVE FOR CARCINOMA    02/05/2023 Surgery   Patient underwent right breast lumpectomy of right anterior mass, right posterior mass and right medial margin reexcision with sentinel lymph node biopsy Pathology showed pT2 pN0   A.  BREAST, RIGHT, ANTERIOR MASS; EXCISION:- MULTIPLE FOCI (at least 6) OF INVASIVE DUCTAL CARCINOMA WITH LOBULAR FEATURES, LARGEST FOCI 6.2 MM; MARGINS NEGATIVE.  Oncotype DX recurrence score of this mass is 24, <1% chemotherapy benefit.]   B.  BREAST, RIGHT, POSTERIOR MASS; EXCISION: - MULTIFOCAL INVASIVE DUCTAL CARCINOMA WITH LOBULAR FEATURES, LARGEST FOCI 21 mm, LARGEST FOCI FOCALLY EXTENDS TO MEDIAL MARGIN (REEXCISED IN C), SEPARATE 1 MM FOCI EXTENDS TO INFERIOR MARGIN [Oncotype DX recurrence score of this mass is 12, no chemotherapy benefit.]  C.  BREAST, RIGHT, MEDIAL MARGIN, REEXCISION: - FOCI OF INVASIVE DUCTAL CARCINOMA WITH LOBULAR FEATURES 2.5 mm, NOT AT MARGIN.  D.  RIGHT SENTINEL LYMPH NODE; EXCISION: - NO EVIDENCE OF METASTATIC CARCINOMA IN 1 SENTINEL LYMPH NODE   TUMOR  Histologic Type: Invasive ductal carcinoma with lobular features.  Histologic Grade (Nottingham Histologic Score)       Glandular (Acinar)/Tubular Differentiation: 3 (of 3)       Nuclear Pleomorphism: 2 (of 3)       Mitotic Rate: 1 (50f 3)       Overall Grade: 2 (of 3)  Tumor Size: 6.2 mm  Tumor Focality: Multifocal  Ductal Carcinoma In Situ (DCIS): Not identified  Tumor Extent: N/A  Lymphatic and/or Vascular Invasion: Not identified  Treatment Effect in the Breast: No known presurgical therapy   MARGINS  Margin Status for Invasive Carcinoma: All margins negative for invasive  carcinoma      Distance from closest margin: 1.6 mm       Specify  closest margin: Posterior      03/02/2023 Cancer Staging   Staging form: Breast, AJCC 8th Edition - Pathologic: Stage IA (pT2, pN0, cM0, G2, ER+, PR+, HER2-, Oncotype DX score: 12) - Signed by Babara Call, MD on 03/02/2023 Stage prefix: Initial diagnosis Multigene prognostic tests performed: Oncotype DX Recurrence score range: Greater than or equal to 11 Histologic grading system: 3 grade system   04/08/2023 Surgery   S/p re-excision by Dr. Curvin LABOR. BREAST, RIGHT INFERIOR/ANTERIOR MARGIN, OF SUPERIOR LUMPECTOMY:  - Benign breast parenchyma with previous procedure-related changes  - Negative for carcinoma   B. BREAST, RIGHT INFERIOR/MIDDLE MARGIN, OF SUPERIOR LUMPECTOMY:  - Benign breast parenchyma with previous procedure-related changes  - Negative for carcinoma   C. BREAST, RIGHT INFERIOR/DEEP MARGIN, OF SUPERIOR LUMPECTOMY:  - Benign breast parenchyma with previous procedure-related changes  - Negative for carcinoma   D. BREAST, RIGHT INFERIOR MARGIN, OF INFERIOR LUMPECTOMY:  - Benign breast parenchyma with previous procedure-related changes  - Negative for carcinoma    05/11/2023 - 06/09/2023 Radiation Therapy   Adjuvant radiation to right breast   06/23/2023 -  Anti-estrogen oral therapy   Start on Arimidex  1 mg daily.    Patient has positive family history of breast cancer and uterine cancer.    INTERVAL HISTORY Emma Torres is a 78 y.o. female who has above history reviewed by me today presents for follow up visit for right breast cancer.  Patient takes Arimidex  1 mg daily.  She has hot flashes and sweating episodes which are not much relieved by Effexor  37.5 mg daily.  No new complaints today.  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)    Cough    chronic   Diverticulosis     Hypercholesterolemia    Hypertension    IBS (irritable bowel syndrome)    Personal history of radiation therapy    Stage 3b chronic kidney disease (CKD) (HCC)     SURGICAL HISTORY: Past Surgical History:  Procedure Laterality Date   ABDOMINAL HYSTERECTOMY     BREAST BIOPSY Right 11/30/2022   MM RT BREAST BX W LOC DEV 1ST LESION IMAGE BX SPEC STEREO GUIDE 11/30/2022 [facility]   BREAST BIOPSY Right 12/24/2022   MM RT RADIO FREQUENCY TAG LOC MAMMO GUIDE 12/24/2022 [facility]   BREAST BIOPSY Right 02/02/2023   MM RT RADIO FREQUENCY TAG LOC MAMMO GUIDE 02/02/2023 [facility]   breast biospy Right 11/30/2022   rt stereo asymetry, coil clip, path pending   BREAST CYST ASPIRATION Left 11/30/2022   BREAST EXCISIONAL BIOPSY Right 2011   negative   BREAST LUMPECTOMY,RADIO FREQ LOCALIZER,AXILLARY SENTINEL LYMPH NODE BIOPSY Right 02/05/2023   Procedure: BREAST LUMPECTOMY,RADIO FREQ LOCALIZER,AXILLARY SENTINEL LYMPH NODE BIOPSY;  Surgeon: Tye Millet, DO;  Location: [facility]S;  Service: General;  Laterality: Right;   CATARACT EXTRACTION W/PHACO Left 11/18/2017   Procedure: CATARACT EXTRACTION PHACO AND INTRAOCULAR LENS PLACEMENT (IOC);  Surgeon: Myrna Adine Anes, MD;  Location: [facility]  ORS;  Service: Ophthalmology;  Laterality: Left;  Lot # [id number] US : 00:28.6 AP%: 10.8 CDE: 3.10    CATARACT EXTRACTION W/PHACO Right 01/13/2018   Procedure: CATARACT EXTRACTION PHACO AND INTRAOCULAR LENS PLACEMENT (IOC);  Surgeon: Myrna Adine Anes, MD;  Location: [facility];  Service: Ophthalmology;  Laterality: Right;  cassette lot #  [id number]  US    00:29.4 AP%   9.6 CDE2.82   COLONOSCOPY     2005, 2009, 2014   COLONOSCOPY     COLONOSCOPY WITH PROPOFOL  N/A 08/29/2018   Procedure: COLONOSCOPY WITH PROPOFOL ;  Surgeon: Viktoria Lamar DASEN, MD;  Location: [facility] ENDOSCOPY;  Service: Endoscopy;  Laterality: N/A;   COLONOSCOPY WITH PROPOFOL  N/A 02/04/2024   Procedure: COLONOSCOPY WITH PROPOFOL ;   Surgeon: Onita Elspeth Sharper, DO;  Location: [facility] ENDOSCOPY;  Service: Gastroenterology;  Laterality: N/A;   POLYPECTOMY  02/04/2024   Procedure: POLYPECTOMY;  Surgeon: Onita Elspeth Sharper, DO;  Location: [facility]COPY;  Service: Gastroenterology;;   RE-EXCISION OF BREAST CANCER,SUPERIOR MARGINS Right 04/08/2023   Procedure: RE-EXCISION OF RIGHT BREAST MARGINS X2;  Surgeon: Curvin Deward MOULD, MD;  Location: [facility];  Service: General;  Laterality: Right;   TONSILLECTOMY     TUBAL LIGATION     VAGINAL HYSTERECTOMY      SOCIAL HISTORY: Social History   Socioeconomic History   Marital status: Married    Spouse name: Dempsey   Number of children: 2   Years of education: Not on file   Highest education level: Not on file  Occupational History   Not on file  Tobacco Use   Smoking status: Never   Smokeless tobacco: Never  Vaping Use   Vaping status: Never Used  Substance and Sexual Activity   Alcohol use: No   Drug use: No   Sexual activity: Not on file  Other Topics Concern   Not on file  Social History Narrative   Not on file   Social Drivers of Health   Financial Resource Strain: Low Risk  (12/02/2023)   Received from [facility]   Overall Financial Resource Strain (CARDIA)    Difficulty of Paying Living Expenses: Not very hard  Food Insecurity: No Food Insecurity (12/02/2023)   Received from [facility]   Hunger Vital Sign    Within the past 12 months, the food you bought just didn't last and you didn't have money to get more.: Never true    Within the past 12 months, you worried that your food would run out before you got the money to buy more.: Never true  Transportation Needs: No Transportation Needs (12/02/2023)   Received from [facility]   PRAPARE - Transportation    Lack of Transportation (Non-Medical): No    In the past 12 months, has lack of transportation kept you from medical appointments  or from getting medications?: No  Physical Activity: Not on file  Stress: Not on file  Social Connections: Not on file  Intimate Partner Violence: Not on file    FAMILY HISTORY: Family History  Problem Relation Age of Onset   Prostate cancer Brother 45   Multiple myeloma Brother 77   Breast cancer Maternal Aunt        dx 75s   Breast cancer Maternal Aunt        dx 107s   Uterine cancer Maternal Grandmother        dx 78s   Breast cancer Cousin  dx 67s    ALLERGIES:  is allergic to tape.  MEDICATIONS:  Current Outpatient Medications  Medication Sig Dispense Refill   acetaminophen  (TYLENOL ) 325 MG tablet Take 650 mg by mouth every 6 (six) hours as needed.     Biotin w/ Vitamins C & E (HAIR/SKIN/NAILS PO) Take 1 tablet by mouth daily.     Blood Glucose Monitoring Suppl (ONE TOUCH ULTRA 2) w/Device KIT See admin instructions.     cyanocobalamin (VITAMIN B12) 1000 MCG tablet Take 1,000 mcg by mouth daily.     glipiZIDE (GLUCOTROL) 10 MG tablet Take 20 mg by mouth 2 (two) times daily before a meal.      glucose blood (ONETOUCH ULTRA) test strip Use once daily E11.9     ibuprofen (ADVIL,MOTRIN) 200 MG tablet Take 200 mg by mouth every 6 (six) hours as needed for headache or moderate pain.     Lancets Misc. (UNISTIK 2 NORMAL) MISC Use 1 each once daily Use as instructed. One Touch Ultra lancets E11.9     lisinopril-hydrochlorothiazide (PRINZIDE,ZESTORETIC) 20-12.5 MG tablet Take 1 tablet by mouth daily.     Magnesium 250 MG TABS Take 250 mg by mouth daily.     Menthol-Methyl Salicylate (MUSCLE RUB) 10-15 % CREA Apply 1 Application topically as needed. 85 g 0   metFORMIN (GLUCOPHAGE) 1000 MG tablet Take 1,000-1,500 mg by mouth See admin instructions. Take 1000 mg by mouth in the morning and take 1500 mg by mouth in the evening     metoprolol succinate (TOPROL-XL) 50 MG 24 hr tablet Take 50 mg by mouth daily. Take with or immediately following a meal.     pravastatin (PRAVACHOL) 40  MG tablet Take 40 mg by mouth daily.     sitaGLIPtin (JANUVIA) 100 MG tablet Take 100 mg by mouth daily.     venlafaxine  XR (EFFEXOR -XR) 75 MG 24 hr capsule Take 1 capsule (75 mg total) by mouth daily with breakfast. 30 capsule 5   anastrozole  (ARIMIDEX ) 1 MG tablet Take 1 tablet (1 mg total) by mouth daily. 90 tablet 1   Calcium Carbonate-Vitamin D (CALTRATE 600+D PO) Take 1 tablet by mouth daily. (Patient not taking: Reported on 01/19/2024)     oxyCODONE  (ROXICODONE ) 5 MG immediate release tablet Take 1 tablet (5 mg total) by mouth every 6 (six) hours as needed for severe pain. 15 tablet 0   No current facility-administered medications for this visit.    Review of Systems  Constitutional:  Negative for appetite change, chills, fatigue and fever.  HENT:   Negative for hearing loss and voice change.   Eyes:  Negative for eye problems.  Respiratory:  Negative for chest tightness and cough.   Cardiovascular:  Negative for chest pain.  Gastrointestinal:  Negative for abdominal distention, abdominal pain and blood in stool.  Endocrine: Positive for hot flashes.  Genitourinary:  Negative for difficulty urinating and frequency.   Musculoskeletal:  Positive for arthralgias.  Skin:  Negative for itching and rash.  Neurological:  Negative for extremity weakness.  Hematological:  Negative for adenopathy.  Psychiatric/Behavioral:  Negative for confusion.      PHYSICAL EXAMINATION: ECOG PERFORMANCE STATUS: 1 - Symptomatic but completely ambulatory  Vitals:   09/19/24 1012 09/19/24 1020  BP: (!) 136/111 132/62  Pulse: 67   Resp: 20   Temp: 98.9 F (37.2 C)   SpO2: 98%    Filed Weights   09/19/24 1012  Weight: 198 lb 6.4 oz (90 kg)    Physical  Exam Constitutional:      General: She is not in acute distress.    Appearance: She is not diaphoretic.  HENT:     Head: Normocephalic.  Eyes:     General: No scleral icterus. Cardiovascular:     Rate and Rhythm: Normal rate.  Pulmonary:      Effort: Pulmonary effort is normal. No respiratory distress.     Breath sounds: No wheezing.  Abdominal:     General: There is no distension.     Palpations: Abdomen is soft.  Musculoskeletal:        General: Normal range of motion.     Cervical back: Normal range of motion and neck supple.  Skin:    General: Skin is warm and dry.     Findings: No erythema.  Neurological:     Mental Status: She is alert and oriented to person, place, and time.     Cranial Nerves: No cranial nerve deficit.     Motor: No abnormal muscle tone.     Coordination: Coordination normal.  Psychiatric:        Mood and Affect: Mood and affect normal.    LABORATORY DATA:  I have reviewed the data as listed    Latest Ref Rng & Units 09/19/2024    9:57 AM 03/20/2024    9:43 AM 09/20/2023   10:33 AM  CBC  WBC 4.0 - 10.5 K/uL 6.7  3.8  4.8   Hemoglobin 12.0 - 15.0 g/dL 86.3  86.8  86.4   Hematocrit 36.0 - 46.0 % 41.9  40.3  42.5   Platelets 150 - 400 K/uL 236  210  226       Latest Ref Rng & Units 09/19/2024    9:57 AM 03/20/2024    9:47 AM 09/20/2023   10:33 AM  CMP  Glucose 70 - 99 mg/dL 881  832  859   BUN 8 - 23 mg/dL 22  20  21    Creatinine 0.44 - 1.00 mg/dL 8.91  9.03  8.93   Sodium 135 - 145 mmol/L 134  137  135   Potassium 3.5 - 5.1 mmol/L 4.0  3.7  3.8   Chloride 98 - 111 mmol/L 102  101  100   CO2 22 - 32 mmol/L 22  26  25    Calcium 8.9 - 10.3 mg/dL 9.3  9.2  9.3   Total Protein 6.5 - 8.1 g/dL 6.8  6.6  7.1   Total Bilirubin 0.0 - 1.2 mg/dL 1.1  0.7  1.0   Alkaline Phos 38 - 126 U/L 55  55  51   AST 15 - 41 U/L 23  20  21    ALT 0 - 44 U/L 17  20  18       RADIOGRAPHIC STUDIES: I have personally reviewed the radiological images as listed and agreed with the findings in the report. No results found.

## 2024-09-19 NOTE — Assessment & Plan Note (Signed)
 Encourage oral hydration and avoid nephrotoxins.

## 2024-09-19 NOTE — Assessment & Plan Note (Signed)
Recommend calcium and vitamin D supplementation.  09/01/2023 DEXA showed normal bone density.

## 2024-10-13 ENCOUNTER — Other Ambulatory Visit: Payer: Self-pay | Admitting: Internal Medicine

## 2024-10-13 DIAGNOSIS — N63 Unspecified lump in unspecified breast: Secondary | ICD-10-CM

## 2024-10-14 ENCOUNTER — Other Ambulatory Visit: Payer: Self-pay | Admitting: Oncology

## 2024-11-01 ENCOUNTER — Other Ambulatory Visit: Payer: Self-pay | Admitting: Internal Medicine

## 2024-11-01 DIAGNOSIS — N2889 Other specified disorders of kidney and ureter: Secondary | ICD-10-CM

## 2024-11-08 ENCOUNTER — Ambulatory Visit
Admission: RE | Admit: 2024-11-08 | Discharge: 2024-11-08 | Disposition: A | Discharge status: Home / Self Care | Source: Ambulatory Visit | Attending: Internal Medicine | Admitting: Internal Medicine

## 2024-11-08 DIAGNOSIS — N2889 Other specified disorders of kidney and ureter: Secondary | ICD-10-CM | POA: Insufficient documentation

## 2024-11-08 MED ORDER — GADOBUTROL 1 MMOL/ML IV SOLN
7.5000 mL | Freq: Once | INTRAVENOUS | Status: AC | PRN
  Administered 2024-11-08: 7.5 mL via INTRAVENOUS

## 2024-11-24 ENCOUNTER — Encounter

## 2024-11-24 ENCOUNTER — Other Ambulatory Visit: Payer: Self-pay | Admitting: Internal Medicine

## 2024-11-24 DIAGNOSIS — N63 Unspecified lump in unspecified breast: Secondary | ICD-10-CM

## 2024-11-28 ENCOUNTER — Other Ambulatory Visit

## 2024-11-28 ENCOUNTER — Inpatient Hospital Stay: Admission: RE | Admit: 2024-11-28 | Discharge: 2024-11-28 | Attending: Internal Medicine

## 2024-11-28 DIAGNOSIS — N63 Unspecified lump in unspecified breast: Secondary | ICD-10-CM

## 2025-03-20 ENCOUNTER — Other Ambulatory Visit

## 2025-03-20 ENCOUNTER — Ambulatory Visit: Admitting: Oncology
